# Patient Record
Sex: Female | Born: 1950 | Race: Black or African American | Hispanic: No | Smoking: Former smoker
Health system: Southern US, Community
[De-identification: ages and names within clinical notes are randomized; demographics above are authoritative.]

## PROBLEM LIST (undated history)

## (undated) DIAGNOSIS — T7840XA Allergy, unspecified, initial encounter: Secondary | ICD-10-CM

## (undated) DIAGNOSIS — T8859XA Other complications of anesthesia, initial encounter: Secondary | ICD-10-CM

## (undated) DIAGNOSIS — F32A Depression, unspecified: Secondary | ICD-10-CM

## (undated) DIAGNOSIS — C50911 Malignant neoplasm of unspecified site of right female breast: Secondary | ICD-10-CM

## (undated) DIAGNOSIS — F329 Major depressive disorder, single episode, unspecified: Secondary | ICD-10-CM

## (undated) DIAGNOSIS — K219 Gastro-esophageal reflux disease without esophagitis: Secondary | ICD-10-CM

## (undated) DIAGNOSIS — M797 Fibromyalgia: Secondary | ICD-10-CM

## (undated) DIAGNOSIS — I2510 Atherosclerotic heart disease of native coronary artery without angina pectoris: Secondary | ICD-10-CM

## (undated) DIAGNOSIS — R0600 Dyspnea, unspecified: Secondary | ICD-10-CM

## (undated) DIAGNOSIS — M51369 Other intervertebral disc degeneration, lumbar region without mention of lumbar back pain or lower extremity pain: Secondary | ICD-10-CM

## (undated) DIAGNOSIS — F419 Anxiety disorder, unspecified: Secondary | ICD-10-CM

## (undated) DIAGNOSIS — M4800 Spinal stenosis, site unspecified: Secondary | ICD-10-CM

## (undated) DIAGNOSIS — M5136 Other intervertebral disc degeneration, lumbar region: Secondary | ICD-10-CM

## (undated) DIAGNOSIS — E559 Vitamin D deficiency, unspecified: Secondary | ICD-10-CM

## (undated) DIAGNOSIS — I10 Essential (primary) hypertension: Secondary | ICD-10-CM

## (undated) HISTORY — PX: ABDOMINAL HYSTERECTOMY: SHX81

## (undated) HISTORY — PX: TOE SURGERY: SHX1073

## (undated) HISTORY — DX: Allergy, unspecified, initial encounter: T78.40XA

## (undated) HISTORY — PX: SHOULDER SURGERY: SHX246

---

## 1996-10-20 DIAGNOSIS — C50911 Malignant neoplasm of unspecified site of right female breast: Secondary | ICD-10-CM

## 1996-10-20 DIAGNOSIS — C50919 Malignant neoplasm of unspecified site of unspecified female breast: Secondary | ICD-10-CM

## 1996-10-20 HISTORY — DX: Malignant neoplasm of unspecified site of unspecified female breast: C50.919

## 1996-10-20 HISTORY — PX: MASTECTOMY: SHX3

## 1996-10-20 HISTORY — PX: BREAST BIOPSY: SHX20

## 1996-10-20 HISTORY — DX: Malignant neoplasm of unspecified site of right female breast: C50.911

## 2010-10-20 HISTORY — PX: AUGMENTATION MAMMAPLASTY: SUR837

## 2010-10-20 HISTORY — PX: REDUCTION MAMMAPLASTY: SUR839

## 2011-02-14 DIAGNOSIS — I2510 Atherosclerotic heart disease of native coronary artery without angina pectoris: Secondary | ICD-10-CM | POA: Insufficient documentation

## 2011-02-14 DIAGNOSIS — E785 Hyperlipidemia, unspecified: Secondary | ICD-10-CM | POA: Insufficient documentation

## 2011-04-07 DIAGNOSIS — Z79899 Other long term (current) drug therapy: Secondary | ICD-10-CM | POA: Insufficient documentation

## 2012-09-06 DIAGNOSIS — I2510 Atherosclerotic heart disease of native coronary artery without angina pectoris: Secondary | ICD-10-CM | POA: Insufficient documentation

## 2012-09-06 DIAGNOSIS — F419 Anxiety disorder, unspecified: Secondary | ICD-10-CM | POA: Insufficient documentation

## 2012-09-06 DIAGNOSIS — M5136 Other intervertebral disc degeneration, lumbar region: Secondary | ICD-10-CM | POA: Insufficient documentation

## 2012-09-06 DIAGNOSIS — M797 Fibromyalgia: Secondary | ICD-10-CM | POA: Insufficient documentation

## 2012-09-06 DIAGNOSIS — M5137 Other intervertebral disc degeneration, lumbosacral region: Secondary | ICD-10-CM | POA: Insufficient documentation

## 2012-09-06 DIAGNOSIS — F411 Generalized anxiety disorder: Secondary | ICD-10-CM | POA: Insufficient documentation

## 2012-09-06 DIAGNOSIS — F329 Major depressive disorder, single episode, unspecified: Secondary | ICD-10-CM | POA: Insufficient documentation

## 2012-09-06 DIAGNOSIS — M51379 Other intervertebral disc degeneration, lumbosacral region without mention of lumbar back pain or lower extremity pain: Secondary | ICD-10-CM | POA: Insufficient documentation

## 2014-08-30 DIAGNOSIS — C50919 Malignant neoplasm of unspecified site of unspecified female breast: Secondary | ICD-10-CM | POA: Insufficient documentation

## 2015-02-15 DIAGNOSIS — I10 Essential (primary) hypertension: Secondary | ICD-10-CM | POA: Insufficient documentation

## 2015-09-07 DIAGNOSIS — E559 Vitamin D deficiency, unspecified: Secondary | ICD-10-CM | POA: Insufficient documentation

## 2016-02-18 ENCOUNTER — Ambulatory Visit: Payer: Self-pay | Attending: Family Medicine | Admitting: Physical Therapy

## 2016-02-19 DIAGNOSIS — E782 Mixed hyperlipidemia: Secondary | ICD-10-CM | POA: Insufficient documentation

## 2016-02-19 DIAGNOSIS — I10 Essential (primary) hypertension: Secondary | ICD-10-CM | POA: Insufficient documentation

## 2016-02-20 ENCOUNTER — Other Ambulatory Visit: Payer: Self-pay | Admitting: Family Medicine

## 2016-02-20 DIAGNOSIS — Z780 Asymptomatic menopausal state: Secondary | ICD-10-CM

## 2016-07-31 ENCOUNTER — Other Ambulatory Visit: Payer: Self-pay | Admitting: *Deleted

## 2016-07-31 ENCOUNTER — Other Ambulatory Visit: Payer: Self-pay | Admitting: Internal Medicine

## 2016-07-31 ENCOUNTER — Inpatient Hospital Stay
Admission: RE | Admit: 2016-07-31 | Discharge: 2016-07-31 | Disposition: A | Discharge status: Home / Self Care | Payer: Self-pay | Source: Ambulatory Visit | Attending: *Deleted | Admitting: *Deleted

## 2016-07-31 DIAGNOSIS — Z1231 Encounter for screening mammogram for malignant neoplasm of breast: Secondary | ICD-10-CM

## 2016-07-31 DIAGNOSIS — Z9289 Personal history of other medical treatment: Secondary | ICD-10-CM

## 2016-08-26 ENCOUNTER — Ambulatory Visit
Admission: RE | Admit: 2016-08-26 | Discharge: 2016-08-26 | Disposition: A | Discharge status: Home / Self Care | Payer: Medicare Other | Source: Ambulatory Visit | Attending: Internal Medicine | Admitting: Internal Medicine

## 2016-08-26 ENCOUNTER — Other Ambulatory Visit: Payer: Self-pay | Admitting: Internal Medicine

## 2016-08-26 DIAGNOSIS — Z1231 Encounter for screening mammogram for malignant neoplasm of breast: Secondary | ICD-10-CM | POA: Diagnosis present

## 2016-11-21 ENCOUNTER — Encounter: Payer: Self-pay | Admitting: *Deleted

## 2016-11-21 ENCOUNTER — Ambulatory Visit: Payer: Medicare Other | Admitting: Anesthesiology

## 2016-11-21 ENCOUNTER — Ambulatory Visit
Admission: RE | Admit: 2016-11-21 | Discharge: 2016-11-21 | Disposition: A | Discharge status: Home / Self Care | Payer: Medicare Other | Source: Ambulatory Visit | Attending: Gastroenterology | Admitting: Gastroenterology

## 2016-11-21 ENCOUNTER — Encounter
Admission: RE | Disposition: A | Discharge status: Home / Self Care | Payer: Self-pay | Source: Ambulatory Visit | Attending: Gastroenterology

## 2016-11-21 DIAGNOSIS — Z7982 Long term (current) use of aspirin: Secondary | ICD-10-CM | POA: Insufficient documentation

## 2016-11-21 DIAGNOSIS — Z6833 Body mass index (BMI) 33.0-33.9, adult: Secondary | ICD-10-CM | POA: Diagnosis not present

## 2016-11-21 DIAGNOSIS — F329 Major depressive disorder, single episode, unspecified: Secondary | ICD-10-CM | POA: Diagnosis not present

## 2016-11-21 DIAGNOSIS — Z853 Personal history of malignant neoplasm of breast: Secondary | ICD-10-CM | POA: Insufficient documentation

## 2016-11-21 DIAGNOSIS — M1990 Unspecified osteoarthritis, unspecified site: Secondary | ICD-10-CM | POA: Diagnosis not present

## 2016-11-21 DIAGNOSIS — Z1211 Encounter for screening for malignant neoplasm of colon: Secondary | ICD-10-CM | POA: Insufficient documentation

## 2016-11-21 DIAGNOSIS — D128 Benign neoplasm of rectum: Secondary | ICD-10-CM | POA: Diagnosis not present

## 2016-11-21 DIAGNOSIS — I10 Essential (primary) hypertension: Secondary | ICD-10-CM | POA: Insufficient documentation

## 2016-11-21 DIAGNOSIS — M5136 Other intervertebral disc degeneration, lumbar region: Secondary | ICD-10-CM | POA: Insufficient documentation

## 2016-11-21 DIAGNOSIS — E559 Vitamin D deficiency, unspecified: Secondary | ICD-10-CM | POA: Insufficient documentation

## 2016-11-21 DIAGNOSIS — F419 Anxiety disorder, unspecified: Secondary | ICD-10-CM | POA: Insufficient documentation

## 2016-11-21 DIAGNOSIS — Z79899 Other long term (current) drug therapy: Secondary | ICD-10-CM | POA: Insufficient documentation

## 2016-11-21 DIAGNOSIS — M797 Fibromyalgia: Secondary | ICD-10-CM | POA: Insufficient documentation

## 2016-11-21 DIAGNOSIS — D124 Benign neoplasm of descending colon: Secondary | ICD-10-CM | POA: Diagnosis not present

## 2016-11-21 DIAGNOSIS — E669 Obesity, unspecified: Secondary | ICD-10-CM | POA: Diagnosis not present

## 2016-11-21 DIAGNOSIS — I2510 Atherosclerotic heart disease of native coronary artery without angina pectoris: Secondary | ICD-10-CM | POA: Diagnosis not present

## 2016-11-21 DIAGNOSIS — Z87891 Personal history of nicotine dependence: Secondary | ICD-10-CM | POA: Insufficient documentation

## 2016-11-21 DIAGNOSIS — K56609 Unspecified intestinal obstruction, unspecified as to partial versus complete obstruction: Secondary | ICD-10-CM | POA: Diagnosis not present

## 2016-11-21 DIAGNOSIS — K219 Gastro-esophageal reflux disease without esophagitis: Secondary | ICD-10-CM | POA: Insufficient documentation

## 2016-11-21 HISTORY — DX: Depression, unspecified: F32.A

## 2016-11-21 HISTORY — PX: COLONOSCOPY WITH PROPOFOL: SHX5780

## 2016-11-21 HISTORY — DX: Essential (primary) hypertension: I10

## 2016-11-21 HISTORY — DX: Vitamin D deficiency, unspecified: E55.9

## 2016-11-21 HISTORY — DX: Gastro-esophageal reflux disease without esophagitis: K21.9

## 2016-11-21 HISTORY — DX: Anxiety disorder, unspecified: F41.9

## 2016-11-21 HISTORY — DX: Major depressive disorder, single episode, unspecified: F32.9

## 2016-11-21 HISTORY — DX: Dyspnea, unspecified: R06.00

## 2016-11-21 HISTORY — DX: Spinal stenosis, site unspecified: M48.00

## 2016-11-21 HISTORY — DX: Other intervertebral disc degeneration, lumbar region without mention of lumbar back pain or lower extremity pain: M51.369

## 2016-11-21 HISTORY — DX: Other intervertebral disc degeneration, lumbar region: M51.36

## 2016-11-21 HISTORY — DX: Atherosclerotic heart disease of native coronary artery without angina pectoris: I25.10

## 2016-11-21 HISTORY — DX: Fibromyalgia: M79.7

## 2016-11-21 SURGERY — COLONOSCOPY WITH PROPOFOL
Anesthesia: General

## 2016-11-21 MED ORDER — LIDOCAINE HCL (CARDIAC) 20 MG/ML IV SOLN
INTRAVENOUS | Status: DC | PRN
Start: 2016-11-21 — End: 2016-11-21
  Administered 2016-11-21: 100 mg via INTRAVENOUS

## 2016-11-21 MED ORDER — SODIUM CHLORIDE 0.9 % IV SOLN: INTRAVENOUS | Status: DC

## 2016-11-21 MED ORDER — PROPOFOL 500 MG/50ML IV EMUL
INTRAVENOUS | Status: AC
  Filled 2016-11-21: qty 50

## 2016-11-21 MED ORDER — SODIUM CHLORIDE 0.9 % IV SOLN
INTRAVENOUS | Status: DC
  Administered 2016-11-21: 1000 mL via INTRAVENOUS
  Administered 2016-11-21: 10:00:00 via INTRAVENOUS

## 2016-11-21 MED ORDER — LIDOCAINE HCL (PF) 2 % IJ SOLN
INTRAMUSCULAR | Status: AC
  Filled 2016-11-21: qty 2

## 2016-11-21 MED ORDER — PROPOFOL 10 MG/ML IV BOLUS
INTRAVENOUS | Status: DC | PRN
  Administered 2016-11-21: 60 mg via INTRAVENOUS

## 2016-11-21 MED ORDER — PROPOFOL 500 MG/50ML IV EMUL
INTRAVENOUS | Status: DC | PRN
  Administered 2016-11-21: 150 ug/kg/min via INTRAVENOUS

## 2016-11-21 NOTE — Op Note (Signed)
[facility] Patient Name: Nancy Day Procedure Date: 11/21/2016 10:03 AM MRN: [id number] Account #: [id number] Date of Birth: 1951/04/26 Admit Type: Outpatient Age: 66 Room: [facility] ENDO ROOM 3 Gender: Female Note Status: Finalized Procedure:            Colonoscopy Indications:          Screening for colorectal malignant neoplasm Providers:            Lollie Sails, MD Referring MD:         Leonie Douglas. Doy Hutching, MD (Referring MD) Medicines:            Monitored Anesthesia Care Complications:        No immediate complications. Procedure:            Pre-Anesthesia Assessment:                       - ASA Grade Assessment: III - A patient with severe                        systemic disease.                       After obtaining informed consent, the colonoscope was                        passed under direct vision. Throughout the procedure,                        the patient's blood pressure, pulse, and oxygen                        saturations were monitored continuously. The                        Colonoscope was introduced through the anus with the                        intention of advancing to the cecum. The scope was                        advanced to the splenic flexure before the procedure                        was aborted. Medications were given. The colonoscopy                        was extremely difficult due to stenosis. The patient                        tolerated the procedure well. The quality of the bowel                        preparation was good. Findings:      Two sessile polyps were found in the rectum. The polyps were 1 to 2 mm       in size. These polyps were removed with a hot biopsy forceps. Resection       and retrieval were complete.      A 2 mm polyp was found in the proximal descending colon. The polyp was       sessile.  The polyp was removed with a cold biopsy forceps. Resection and       retrieval were complete.    A Marked stenosis was found at the splenic flexure and was       non-traversed. The scope was advanced to this area, then could not be       taken further despite multiple position changes and abdominal suport.      The digital rectal exam was normal. Impression:           - Two 1 to 2 mm polyps in the rectum, removed with a                        hot biopsy forceps. Resected and retrieved.                       - One 2 mm polyp in the proximal descending colon,                        removed with a cold biopsy forceps. Resected and                        retrieved.                       - Stricture at the splenic flexure. Recommendation:       - Perform a virtual colonoscopy in 2 weeks. Procedure Code(s):    --- Professional ---                       [id number], 52, Colonoscopy, flexible; with removal of                        tumor(s), polyp(s), or other lesion(s) by hot biopsy                        forceps                       45380, 59,52, Colonoscopy, flexible; with biopsy,                        single or multiple Diagnosis Code(s):    --- Professional ---                       Z12.11, Encounter for screening for malignant neoplasm                        of colon                       K62.1, Rectal polyp                       D12.4, Benign neoplasm of descending colon                       K56.69, Other intestinal obstruction CPT copyright 2016 [employer]. All rights reserved. The codes documented in this report are preliminary and upon coder review may  be revised to meet current compliance requirements. Lollie Sails, MD 11/21/2016 10:56:48 AM This report has been signed electronically. Number of Addenda: 0 Note Initiated On: 11/21/2016 10:03  AM Total Procedure Duration: 0 hours 39 minutes 35 seconds       [facility]

## 2016-11-21 NOTE — Anesthesia Preprocedure Evaluation (Signed)
Anesthesia Evaluation  Patient identified by MRN, date of birth, ID band Patient awake    Reviewed: Allergy & Precautions, NPO status , Patient's Chart, lab work & pertinent test results  History of Anesthesia Complications Negative for: history of anesthetic complications  Airway Mallampati: II  TM Distance: >3 FB Neck ROM: Full    Dental no notable dental hx.    Pulmonary neg shortness of breath, neg sleep apnea, neg COPD, former smoker,    breath sounds clear to auscultation- rhonchi (-) wheezing      Cardiovascular Exercise Tolerance: Good hypertension, Pt. on medications (-) angina+ CAD (nonocclusive)   Rhythm:Regular Rate:Normal - Systolic murmurs and - Diastolic murmurs    Neuro/Psych PSYCHIATRIC DISORDERS Anxiety Depression    GI/Hepatic Neg liver ROS, GERD  ,  Endo/Other  negative endocrine ROSneg diabetes  Renal/GU negative Renal ROS     Musculoskeletal  (+) Arthritis , Fibromyalgia -  Abdominal (+) + obese,   Peds  Hematology negative hematology ROS (+)   Anesthesia Other Findings Past Medical History: No date: Anxiety 1998: Breast cancer ([place])     Comment: mastectomy and reconstruction in 2012 No date: Coronary artery disease No date: DDD (degenerative disc disease), lumbar No date: Depression No date: Dyspnea No date: Fibromyalgia No date: GERD (gastroesophageal reflux disease) No date: Hypertension No date: Spinal stenosis No date: Vitamin D deficiency   Reproductive/Obstetrics                             Anesthesia Physical Anesthesia Plan  ASA: III  Anesthesia Plan: General   Post-op Pain Management:    Induction: Intravenous  Airway Management Planned: Natural Airway  Additional Equipment:   Intra-op Plan:   Post-operative Plan:   Informed Consent: I have reviewed the patients History and Physical, chart, labs and discussed the procedure including  the risks, benefits and alternatives for the proposed anesthesia with the patient or authorized representative who has indicated his/her understanding and acceptance.   Dental advisory given  Plan Discussed with: CRNA and Anesthesiologist  Anesthesia Plan Comments:         Anesthesia Quick Evaluation

## 2016-11-21 NOTE — Transfer of Care (Signed)
Immediate Anesthesia Transfer of Care Note  Patient: Nancy Day  Procedure(s) Performed: Procedure(s): COLONOSCOPY WITH PROPOFOL (N/A)  Patient Location: Endoscopy Unit  Anesthesia Type:General  Level of Consciousness: awake, alert , oriented and patient cooperative  Airway & Oxygen Therapy: Patient Spontanous Breathing and Patient connected to nasal cannula oxygen  Post-op Assessment: Report given to RN, Post -op Vital signs reviewed and stable and Patient moving all extremities X 4  Post vital signs: Reviewed and stable  Last Vitals:  Vitals:   11/21/16 0945  BP: 125/86  Pulse: 87  Resp: 18  Temp: 36.2 C    Last Pain:  Vitals:   11/21/16 0945  TempSrc: Tympanic         Complications: No apparent anesthesia complications

## 2016-11-21 NOTE — Anesthesia Post-op Follow-up Note (Cosign Needed)
Anesthesia QCDR form completed.        

## 2016-11-21 NOTE — Anesthesia Postprocedure Evaluation (Signed)
Anesthesia Post Note  Patient: Arrington Krebsbach  Procedure(s) Performed: Procedure(s) (LRB): COLONOSCOPY WITH PROPOFOL (N/A)  Patient location during evaluation: Endoscopy Anesthesia Type: General Level of consciousness: awake and alert and oriented Pain management: pain level controlled Vital Signs Assessment: post-procedure vital signs reviewed and stable Respiratory status: spontaneous breathing, nonlabored ventilation and respiratory function stable Cardiovascular status: blood pressure returned to baseline and stable Postop Assessment: no signs of nausea or vomiting Anesthetic complications: no     Last Vitals:  Vitals:   11/21/16 1120 11/21/16 1130  BP: (!) 145/76 (!) 167/81  Pulse: 74 93  Resp: 19 11  Temp:      Last Pain:  Vitals:   11/21/16 1050  TempSrc: Tympanic                 Rozanne Heumann

## 2016-11-21 NOTE — H&P (Signed)
Outpatient short stay form Pre-procedure 11/21/2016 9:33 AM Lollie Sails MD  Primary Physician: Dr. Fulton Reek  Reason for visit:  Colonoscopy  History of present illness:  Patient is a 66 year old female presenting today for a screening colonoscopy. She takes an 81 mg aspirin daily. Her last colonoscopy was about 10 years ago. There is no personal or family history of colon polyps or colon cancer. She tolerated her prep well. She does take a 81 mg aspirin daily but has not for the past 2 days. She takes no other aspirin products or blood thinning agents.     Current Facility-Administered Medications:  .  0.9 %  sodium chloride infusion, , Intravenous, Continuous, Lollie Sails, MD  Prescriptions Prior to Admission  Medication Sig Dispense Refill Last Dose  . acetaminophen (TYLENOL) 500 MG tablet Take 500 mg by mouth every 6 (six) hours as needed.     Marland Kitchen aspirin EC 81 MG tablet Take 81 mg by mouth daily.     Marland Kitchen BIOTIN PO Take 15,000 mcg by mouth daily at 2 PM.     . Cholecalciferol 10000 units TABS Take by mouth.     . Cinnamon 500 MG TABS Take by mouth daily at 2 PM.     . cyclobenzaprine (FLEXERIL) 10 MG tablet Take 10 mg by mouth 3 (three) times daily as needed for muscle spasms.     Marland Kitchen diltiazem (TIAZAC) 360 MG 24 hr capsule Take 360 mg by mouth daily.     . DULoxetine (CYMBALTA) 30 MG capsule Take 30 mg by mouth 2 (two) times daily.     . Garlic 123XX123 MG CAPS Take by mouth daily at 2 PM.     . losartan-hydrochlorothiazide (HYZAAR) 100-25 MG tablet Take 1 tablet by mouth daily.     . niacin (NIASPAN) 1000 MG CR tablet Take 1,000 mg by mouth at bedtime.     Marland Kitchen omeprazole (PRILOSEC) 20 MG capsule Take 20 mg by mouth daily.     . potassium chloride (K-DUR) 10 MEQ tablet Take 10 mEq by mouth 2 (two) times daily.     . pregabalin (LYRICA) 100 MG capsule Take 100 mg by mouth 3 (three) times daily.     . rosuvastatin (CRESTOR) 20 MG tablet Take 20 mg by mouth daily.     . traMADol  (ULTRAM) 50 MG tablet Take by mouth 3 (three) times daily as needed.     . Turmeric POWD by Does not apply route every morning.     . zolpidem (AMBIEN) 10 MG tablet Take 5 mg by mouth at bedtime as needed for sleep.        Allergies not on file   Past Medical History:  Diagnosis Date  . Anxiety   . Breast cancer ([place]) 1998   mastectomy and reconstruction in 2012  . Coronary artery disease   . DDD (degenerative disc disease), lumbar   . Depression   . Dyspnea   . Fibromyalgia   . GERD (gastroesophageal reflux disease)   . Hypertension   . Spinal stenosis   . Vitamin D deficiency     Review of systems:      Physical Exam    Heart and lungs: Regular rate and rhythm without rub or gallop, lungs are bilaterally clear.    HEENT: Normocephalic atraumatic eyes are anicteric    Other:     Pertinant exam for procedure: Soft nontender nondistended bowel sounds positive normoactive.    Planned proceedures: Colonoscopy and  indicated procedures. I have discussed the risks benefits and complications of procedures to include not limited to bleeding, infection, perforation and the risk of sedation and the patient wishes to proceed.    Lollie Sails, MD Gastroenterology 11/21/2016  9:33 AM

## 2016-11-24 ENCOUNTER — Encounter: Payer: Self-pay | Admitting: Gastroenterology

## 2016-11-24 LAB — SURGICAL PATHOLOGY

## 2016-11-25 ENCOUNTER — Other Ambulatory Visit: Payer: Self-pay | Admitting: Gastroenterology

## 2016-11-25 DIAGNOSIS — Z860100 Personal history of colon polyps, unspecified: Secondary | ICD-10-CM

## 2016-12-31 ENCOUNTER — Ambulatory Visit
Admission: RE | Admit: 2016-12-31 | Discharge: 2016-12-31 | Disposition: A | Discharge status: Home / Self Care | Payer: Medicare Other | Source: Ambulatory Visit | Attending: Gastroenterology | Admitting: Gastroenterology

## 2016-12-31 DIAGNOSIS — Z860100 Personal history of colon polyps, unspecified: Secondary | ICD-10-CM

## 2017-05-21 DIAGNOSIS — R42 Dizziness and giddiness: Secondary | ICD-10-CM | POA: Insufficient documentation

## 2017-06-16 ENCOUNTER — Telehealth (HOSPITAL_COMMUNITY): Payer: Self-pay

## 2017-06-16 ENCOUNTER — Ambulatory Visit: Payer: Medicare Other | Attending: Nurse Practitioner | Admitting: Nurse Practitioner

## 2017-06-16 ENCOUNTER — Encounter: Payer: Self-pay | Admitting: Nurse Practitioner

## 2017-06-16 VITALS — BP 138/92 | HR 96 | Temp 98.1°F | Resp 16 | Ht 67.5 in | Wt 223.0 lb

## 2017-06-16 DIAGNOSIS — G8929 Other chronic pain: Secondary | ICD-10-CM | POA: Diagnosis not present

## 2017-06-16 DIAGNOSIS — M25511 Pain in right shoulder: Secondary | ICD-10-CM | POA: Diagnosis not present

## 2017-06-16 DIAGNOSIS — M533 Sacrococcygeal disorders, not elsewhere classified: Secondary | ICD-10-CM | POA: Diagnosis not present

## 2017-06-16 DIAGNOSIS — M545 Low back pain: Secondary | ICD-10-CM | POA: Diagnosis not present

## 2017-06-16 DIAGNOSIS — M25562 Pain in left knee: Secondary | ICD-10-CM | POA: Diagnosis not present

## 2017-06-16 DIAGNOSIS — I129 Hypertensive chronic kidney disease with stage 1 through stage 4 chronic kidney disease, or unspecified chronic kidney disease: Secondary | ICD-10-CM | POA: Diagnosis not present

## 2017-06-16 DIAGNOSIS — Z7982 Long term (current) use of aspirin: Secondary | ICD-10-CM | POA: Insufficient documentation

## 2017-06-16 DIAGNOSIS — G894 Chronic pain syndrome: Secondary | ICD-10-CM | POA: Diagnosis not present

## 2017-06-16 DIAGNOSIS — Z79899 Other long term (current) drug therapy: Secondary | ICD-10-CM | POA: Insufficient documentation

## 2017-06-16 DIAGNOSIS — Z885 Allergy status to narcotic agent status: Secondary | ICD-10-CM | POA: Diagnosis not present

## 2017-06-16 DIAGNOSIS — Z90710 Acquired absence of both cervix and uterus: Secondary | ICD-10-CM | POA: Diagnosis not present

## 2017-06-16 DIAGNOSIS — M5442 Lumbago with sciatica, left side: Secondary | ICD-10-CM

## 2017-06-16 DIAGNOSIS — Z87891 Personal history of nicotine dependence: Secondary | ICD-10-CM | POA: Insufficient documentation

## 2017-06-16 DIAGNOSIS — Z853 Personal history of malignant neoplasm of breast: Secondary | ICD-10-CM | POA: Diagnosis not present

## 2017-06-16 DIAGNOSIS — M542 Cervicalgia: Secondary | ICD-10-CM

## 2017-06-16 DIAGNOSIS — Z9889 Other specified postprocedural states: Secondary | ICD-10-CM | POA: Diagnosis not present

## 2017-06-16 DIAGNOSIS — M79604 Pain in right leg: Secondary | ICD-10-CM | POA: Diagnosis present

## 2017-06-16 DIAGNOSIS — M25512 Pain in left shoulder: Secondary | ICD-10-CM

## 2017-06-16 DIAGNOSIS — K589 Irritable bowel syndrome without diarrhea: Secondary | ICD-10-CM | POA: Insufficient documentation

## 2017-06-16 DIAGNOSIS — M79605 Pain in left leg: Secondary | ICD-10-CM

## 2017-06-16 DIAGNOSIS — M797 Fibromyalgia: Secondary | ICD-10-CM | POA: Insufficient documentation

## 2017-06-16 DIAGNOSIS — E782 Mixed hyperlipidemia: Secondary | ICD-10-CM | POA: Diagnosis not present

## 2017-06-16 DIAGNOSIS — K219 Gastro-esophageal reflux disease without esophagitis: Secondary | ICD-10-CM | POA: Insufficient documentation

## 2017-06-16 DIAGNOSIS — M5441 Lumbago with sciatica, right side: Secondary | ICD-10-CM

## 2017-06-16 DIAGNOSIS — I2510 Atherosclerotic heart disease of native coronary artery without angina pectoris: Secondary | ICD-10-CM | POA: Diagnosis not present

## 2017-06-16 DIAGNOSIS — Z79891 Long term (current) use of opiate analgesic: Secondary | ICD-10-CM | POA: Diagnosis not present

## 2017-06-16 NOTE — Progress Notes (Signed)
Patient's Name: Nancy Day  MRN: [id number]  Referring Provider: Idelle Crouch, MD  DOB: 1951-06-24  PCP: Idelle Crouch, MD  DOS: 06/16/2017  Note by: Dionisio David NP  Service setting: Ambulatory outpatient  Specialty: Interventional Pain Management  Location: [facility] (AMB) Pain Management Facility    Patient type: New Patient    Primary Reason(s) for Visit: Initial Patient Evaluation CC: Fibromyalgia (entire body ) and Knee Pain (left)  HPI  Nancy Day is a 66 y.o. year old, female patient, who comes today for an initial evaluation. She has history of Breast cancer ([place]); DDD (degenerative disc disease), lumbar; DDD (degenerative disc disease), lumbosacral; and Chronic pain syndrome;  on her problem list.. Her primarily concern today is the Fibromyalgia (entire body ) and Knee Pain (left)  Pain Assessment: Location: Other (Comment) (entire body) Neck Radiating: neck to feet Onset: More than a month ago Duration: Chronic pain Quality: Constant, Numbness, Throbbing, Aching (sensitive to touch) Severity: 7 /10 (self-reported pain score)  Note: Reported level is compatible with observation.                   Effect on ADL: cantdo what she wants to do,washing dishes, rest Timing:   Modifying factors: medications Lyrica and Tramadol,mattress pad heater  Onset and Duration: Date of onset: 66 and Present longer than 3 months Cause of pain: Fibromyalgia, Spinal Stenosis, Sciatica, Degengerative Disk Disese Severity: Getting worse, NAS-11 at its worse: 10/10, NAS-11 at its best: 6/10, NAS-11 now: 7/10 and NAS-11 on the average: 7/10 Timing: Not influenced by the time of the day Aggravating Factors: Bending, Climbing, Kneeling, Motion, Prolonged standing, Squatting, Stooping , Walking, Walking uphill and Walking downhill Alleviating Factors: Lying down, Medications and Nothing so far Associated Problems: Depression, Fatigue, Inability to concentrate, Numbness, Sadness, Spasms, Suicidal  ideations, Sweating, Weakness, Pain that wakes patient up and Pain that does not allow patient to sleep Quality of Pain: Aching, Agonizing, Annoying, Disabling, Distressing, Feeling of constriction, Feeling of weight, Getting shorter, Horrible, Itching, Nagging and Throbbing Previous Examinations or Tests: Epidurogram Previous Treatments: Epidural steroid injections and TENS  The patient comes into the clinics today for the first time for a chronic pain management evaluation. According to the patient she has generalized pain secondary to fibromyalgia. She admits she was diagnosed in 73. She admits that she has failed Lyrica and gabapentin. She has previously been on tramadol which she has stopped.  Her second area of pain is in her lower back. She admits that she's been having back pain for 10-15 years. She denies any precipitating factors she denies any previous surgeries. She has had epidural steroid injections in the past from [facility]; they were effective for approximately one week. She states that over time the efficacy continue to decrease that she stopped the injections. She denies any recent physical therapy or images.  Her third area of pain is in her lower extremities she admits the left is greater than the right. She has radicular symptoms that goes down to her knee. She does have some numbness tingling with recent weakness.   Her fourth area of pain is in her left knee. She is having swelling and weakness. She denies any previous surgery, interventional therapy, physical therapy. She did have recent x-rays at [facility] clinic.  She admits that she is having constant bilateral shoulder pain. with right being greater than left. She has had history of rotator cuff repair on the right 2017. She denies any numbness tingling does  have some limitations.  Her last area of pain is in her neck with right being greater than left The pain goes down into her shoulders. She denies any numbness  tingling in her upper extremities. She denies any previous surgery, interventional therapy, physical therapy or recent images.   Today I took the time to provide the patient with information regarding this pain practice. The patient was informed that the practice is divided into two sections: an interventional pain management section, as well as a completely separate and distinct medication management section. I explained that there are procedure days for interventional therapies, and evaluation days for follow-ups and medication management. Because of the amount of documentation required during both, they are kept separated. This means that there is the possibility that she may be scheduled for a procedure on one day, and medication management the next. I have also informedher that because of staffing and facility limitations, this practice will no longer take patients for medication management only. To illustrate the reasons for this, I gave the patient the example of surgeons, and how inappropriate it would be to refer a patient to her care, just to write for the post-surgical antibiotics on a surgery done by a different surgeon.   Because interventional pain management is part of the board-certified specialty for the doctors, the patient was informed that joining this practice means that they are open to any and all interventional therapies. I made it clear that this does not mean that they will be forced to have any procedures done. What this means is that I believe interventional therapies to be essential part of the diagnosis and proper management of chronic pain conditions. Therefore, patients not interested in these interventional alternatives will be better served under the care of a different practitioner.  The patient was also made aware of my Comprehensive Pain Management Safety Guidelines where by joining this practice, they limit all of their nerve blocks and joint injections to those done by  our practice, for as long as we are retained to manage their care. Historic Controlled Substance Pharmacotherapy Review  PMP and historical list of controlled substances: tramadol 50 mg, Lyrica 75 mg, zolpidem 10 mg, hydromorphone 2 mg, zolpidem extended release 12.5 mg Highest opioid analgesic regimen found:hydromorphone 2 mg2 tablets 3 times daily (fill date 02/21/2014) hydromorphone 48 mg per day Most recent opioid analgesic: tramadol 50 mg 3 times daily (fill date 06/27/2015)tramadol 150 mg per day Current opioid analgesics: tramadol Highest recorded MME/day: 48 mg/day MME/day:20 mg/day Medications: The patient did not bring the medication(s) to the appointment, as requested in our "New Patient Package" Pharmacodynamics: Desired effects: Analgesia: The patient reports >50% benefit. Reported improvement in function: The patient reports medication allows her to accomplish basic ADLs. Clinically meaningful improvement in function (CMIF): Sustained CMIF goals met Perceived effectiveness: Described as relatively effective, allowing for increase in activities of daily living (ADL) Undesirable effects: Side-effects or Adverse reactions: None reported Historical Monitoring: The patient  reports that she does not use drugs. List of all UDS Test(s): No results found for: MDMA, COCAINSCRNUR, PCPSCRNUR, PCPQUANT, CANNABQUANT, THCU, [place] List of all Serum Drug Screening Test(s):  No results found for: AMPHSCRSER, BARBSCRSER, BENZOSCRSER, COCAINSCRSER, PCPSCRSER, PCPQUANT, THCSCRSER, CANNABQUANT, OPIATESCRSER, OXYSCRSER, PROPOXSCRSER Historical Background Evaluation: [place] PDMP: Six (6) year initial data search conducted.             [place] Department of public safety, offender search: Editor, commissioning Information) Non-contributory Risk Assessment Profile: Aberrant behavior: None observed or detected today Risk  factors for fatal opioid overdose: None identified today Fatal overdose hazard ratio (HR): Calculation  deferred Non-fatal overdose hazard ratio (HR): Calculation deferred Risk of opioid abuse or dependence: 0.7-3.0% with doses ? 36 MME/day and 6.1-26% with doses ? 120 MME/day. Substance use disorder (SUD) risk level: Pending results of Medical Psychology Evaluation for SUD Opioid risk tool (ORT) (Total Score): 3  ORT Scoring interpretation table:  Score <3 = Low Risk for SUD  Score between 4-7 = Moderate Risk for SUD  Score >8 = High Risk for Opioid Abuse   PHQ-2 Depression Scale:  Total score: 2  PHQ-2 Scoring interpretation table: (Score and probability of major depressive disorder)  Score 0 = No depression  Score 1 = 15.4% Probability  Score 2 = 21.1% Probability  Score 3 = 38.4% Probability  Score 4 = 45.5% Probability  Score 5 = 56.4% Probability  Score 6 = 78.6% Probability   PHQ-9 Depression Scale:  Total score: 14  PHQ-9 Scoring interpretation table:  Score 0-4 = No depression  Score 5-9 = Mild depression  Score 10-14 = Moderate depression  Score 15-19 = Moderately severe depression  Score 20-27 = Severe depression (2.4 times higher risk of SUD and 2.89 times higher risk of overuse)   Pharmacologic Plan: Pending ordered tests and/or consults  Meds  The patient has a current medication list which includes the following prescription(s): acetaminophen, aspirin ec, biotin, cholecalciferol, cinnamon, coenzyme q-10, b-12, cyclobenzaprine, diltiazem, duloxetine, garlic, lidocaine, losartan-hydrochlorothiazide, niacin, pregabalin, rosuvastatin, turmeric, zolpidem, omeprazole, potassium chloride, and tramadol.  Current Outpatient Prescriptions on File Prior to Visit  Medication Sig  . acetaminophen (TYLENOL) 500 MG tablet Take 500 mg by mouth every 6 (six) hours as needed.  Marland Kitchen aspirin EC 81 MG tablet Take 81 mg by mouth daily.  Marland Kitchen BIOTIN PO Take 15,000 mcg by mouth daily at 2 PM.  . Cholecalciferol 10000 units TABS Take by mouth.  . Cinnamon 500 MG TABS Take by mouth daily at 2  PM.  . cyclobenzaprine (FLEXERIL) 10 MG tablet Take 10 mg by mouth 3 (three) times daily as needed for muscle spasms.  Marland Kitchen diltiazem (TIAZAC) 360 MG 24 hr capsule Take 360 mg by mouth daily.  . DULoxetine (CYMBALTA) 30 MG capsule Take 30 mg by mouth 2 (two) times daily.  . Garlic 3614 MG CAPS Take by mouth daily at 2 PM.  . losartan-hydrochlorothiazide (HYZAAR) 100-25 MG tablet Take 1 tablet by mouth daily.  . niacin (NIASPAN) 1000 MG CR tablet Take 1,000 mg by mouth at bedtime.  . pregabalin (LYRICA) 100 MG capsule Take 100 mg by mouth 3 (three) times daily.  . rosuvastatin (CRESTOR) 20 MG tablet Take 20 mg by mouth daily.  . Turmeric POWD by Does not apply route every morning.  . zolpidem (AMBIEN) 10 MG tablet Take 5 mg by mouth at bedtime as needed for sleep.  Marland Kitchen omeprazole (PRILOSEC) 20 MG capsule Take 20 mg by mouth daily.  . potassium chloride (K-DUR) 10 MEQ tablet Take 10 mEq by mouth 2 (two) times daily.  . traMADol (ULTRAM) 50 MG tablet Take by mouth 3 (three) times daily as needed.   No current facility-administered medications on file prior to visit.    Imaging Review  Note: No new results found.        ROS  Cardiovascular History: Daily Aspirin intake and High blood pressure Pulmonary or Respiratory History: No reported pulmonary signs or symptoms such as wheezing and difficulty taking a deep full breath (  Asthma), difficulty blowing air out (Emphysema), coughing up mucus (Bronchitis), persistent dry cough, or temporary stoppage of breathing during sleep Neurological History: No reported neurological signs or symptoms such as seizures, abnormal skin sensations, urinary and/or fecal incontinence, being born with an abnormal open spine and/or a tethered spinal cord Review of Past Neurological Studies: No results found for this or any previous visit. Psychological-Psychiatric History: Anxiousness, Depressed, Suicidal ideations and Difficulty sleeping and or falling  asleep Gastrointestinal History: Alternating episodes iof diarrhea and constipation (IBS-Irritable bowe syndrome) Genitourinary History: No reported renal or genitourinary signs or symptoms such as difficulty voiding or producing urine, peeing blood, non-functioning kidney, kidney stones, difficulty emptying the bladder, difficulty controlling the flow of urine, or chronic kidney disease Hematological History: Brusing easily Endocrine History: No reported endocrine signs or symptoms such as high or low blood sugar, rapid heart rate due to high thyroid levels, obesity or weight gain due to slow thyroid or thyroid disease Rheumatologic History: Generalized muscle aches (Fibromyalgia) and Constant unexplained fatigue (Chronic Fatigue Syndrome) Musculoskeletal History: Negative for myasthenia gravis, muscular dystrophy, multiple sclerosis or malignant hyperthermia Work History: Disabled  Allergies  Ms. Skufca is allergic to codeine.  Laboratory Chemistry  Inflammation Markers No results found for: CRP, ESRSEDRATE (CRP: Acute Phase) (ESR: Chronic Phase) Renal Function Markers No results found for: BUN, CREATININE, GFRAA, GFRNONAA Hepatic Function Markers No results found for: AST, ALT, ALBUMIN, ALKPHOS, HCVAB Electrolytes No results found for: NA, K, CL, CALCIUM, MG Neuropathy Markers No results found for: PQZRAQTM22 Bone Pathology Markers No results found for: Hendricks Milo, VD125OH2TOT, [id number], QJ3354TG2, 25OHVITD1, 25OHVITD2, 25OHVITD3, CALCIUM, TESTOFREE, TESTOSTERONE Coagulation Parameters No results found for: INR, LABPROT, APTT, PLT Cardiovascular Markers No results found for: BNP, HGB, HCT Note: Lab results reviewed.  [place]  Drug: Ms. Widjaja  reports that she does not use drugs. Alcohol:  reports that she drinks alcohol. Tobacco:  reports that she has quit smoking. She has never used smokeless tobacco. Medical:  has a past medical history of Allergy; Anxiety; Breast cancer  ([place]) (1998); Coronary artery disease; DDD (degenerative disc disease), lumbar; Depression; Dyspnea; Fibromyalgia; GERD (gastroesophageal reflux disease); Hypertension; Spinal stenosis; and Vitamin D deficiency. Family: family history is not on file.  Past Surgical History:  Procedure Laterality Date  . ABDOMINAL HYSTERECTOMY    . AUGMENTATION MAMMAPLASTY Right 2012   right  . BREAST BIOPSY Right 1998   stereotactic biopsy - positive  . COLONOSCOPY WITH PROPOFOL N/A 11/21/2016   Procedure: COLONOSCOPY WITH PROPOFOL;  Surgeon: Lollie Sails, MD;  Location: [facility] ENDOSCOPY;  Service: Endoscopy;  Laterality: N/A;  . MASTECTOMY Right 1998  . SHOULDER SURGERY    . TOE SURGERY     Active Ambulatory Problems    Diagnosis Date Noted  . Anxiety state 09/06/2012  . Anxiety 09/06/2012  . Benign essential HTN 02/19/2016  . Breast cancer ([place]) 08/30/2014  . CAD (coronary artery disease) 09/06/2012  . Coronary atherosclerosis 02/14/2011  . DDD (degenerative disc disease), lumbar 09/06/2012  . DDD (degenerative disc disease), lumbosacral 09/06/2012  . Depression 09/06/2012  . Depressive disorder 09/06/2012  . Dizziness 05/21/2017  . Encounter for long-term (current) use of other medications 04/07/2011  . Essential hypertension 02/15/2015  . Fibromyalgia(Primary Area of Pain)  09/06/2012  . Hyperlipidemia 02/14/2011  . Mixed hyperlipidemia 02/19/2016  . Myalgia and myositis 09/06/2012  . Spinal stenosis 08/30/2014  . Vitamin D deficiency, unspecified 09/07/2015  . Long term current use of opiate analgesic 06/16/2017  . Chronic pain syndrome  06/16/2017  . Chronic neck pain 06/16/2017  . Chronic pain of left knee (Fourth Area of Pain) 06/16/2017  . Chronic low back pain  (Secondary Area of Pain)(L>R) 06/16/2017  . Chronic sacroiliac joint pain 06/16/2017  . Chronic shoulder paintrack by(Fifth Area of Pain) (R>L) 06/16/2017  . Chronic pain of lower extremity (Secondary Area of Pain) (L>R)  06/16/2017   Resolved Ambulatory Problems    Diagnosis Date Noted  . No Resolved Ambulatory Problems   Past Medical History:  Diagnosis Date  . Allergy   . Anxiety   . Breast cancer ([place]) 1998  . Coronary artery disease   . DDD (degenerative disc disease), lumbar   . Depression   . Dyspnea   . Fibromyalgia   . GERD (gastroesophageal reflux disease)   . Hypertension   . Spinal stenosis   . Vitamin D deficiency    Constitutional Exam  General appearance: Well nourished, well developed, and well hydrated. In no apparent acute distress Vitals:   06/16/17 0805  BP: (!) 138/92  Pulse: 96  Resp: 16  Temp: 98.1 F (36.7 C)  SpO2: 100%  Weight: 223 lb (101.2 kg)  Height: 5' 7.5" (1.715 m)   BMI Assessment: Estimated body mass index is 34.41 kg/m as calculated from the following:   Height as of this encounter: 5' 7.5" (1.715 m).   Weight as of this encounter: 223 lb (101.2 kg). Psych/Mental status: Alert, oriented x 3 (person, place, & time)       Eyes: PERLA Respiratory: No evidence of acute respiratory distress  Cervical Spine Exam  Inspection: No masses, redness, or swelling Alignment: Symmetrical Functional ROM: Unrestricted ROM      Stability: No instability detected Muscle strength & Tone: Functionally intact Sensory: Unimpaired Palpation: Complains of area being tender to palpation              Upper Extremity (UE) Exam    Side: Right upper extremity  Side: Left upper extremity  Inspection: No masses, redness, swelling, or asymmetry. No contractures  Inspection: No masses, redness, swelling, or asymmetry. No contractures  Functional ROM: Decreased ROM          Functional ROM: Decreased ROM          Muscle strength & Tone: Movement possible against gravity, but not against resistance (3/5)  Muscle strength & Tone: Movement possible against gravity, but not against resistance (3/5)  Sensory: Unimpaired  Sensory: Unimpaired  Palpation: No palpable anomalies               Palpation: No palpable anomalies              Specialized Test(s): Deferred         Specialized Test(s): Deferred          Thoracic Spine Exam  Inspection: No masses, redness, or swelling Alignment: Symmetrical Functional ROM: Unrestricted ROM Stability: No instability detected Sensory: Unimpaired Muscle strength & Tone: No palpable anomalies  Lumbar Spine Exam  Inspection: No masses, redness, or swelling Alignment: Symmetrical Functional ROM: Pain restricted ROM, bilaterally Stability: No instability detected Muscle strength & Tone: Functionally intact Sensory: Unimpaired Palpation: Complains of area being tender to palpation       Provocative Tests: Lumbar Hyperextension and rotation test: Unable to perform       Patrick's Maneuver: evaluation deferred today for left-sided S-I arthralgia              Gait & Posture Assessment  Ambulation: Unassisted Gait: Relatively normal  for age and body habitus Posture: WNL   Lower Extremity Exam    Side: Right lower extremity  Side: Left lower extremity  Inspection: No masses, redness, swelling, or asymmetry. No contractures  Inspection: Edema  Functional ROM: Unrestricted ROM          Functional ROM: Pain restricted ROM for knee joint  Muscle strength & Tone: unable to perform heel-to-toe walking  Muscle strength & Tone: unable to perform heel-to-toe walking  Sensory: Unimpaired  Sensory: Unimpaired  Palpation: Complains of area being tender to palpation  Palpation: Complains of area being tender to palpation   Assessment  Primary Diagnosis & Pertinent Problem List: The primary encounter diagnosis was Fibromyalgia. Diagnoses of Chronic bilateral low back pain, with sciatica presence unspecified, Chronic pain of left knee, Chronic neck pain, Chronic pain of both shoulders, Chronic sacroiliac joint pain, Chronic pain syndrome, Long term current use of opiate analgesic, and Chronic pain of both lower extremities (Tertiary Area of Pain)  were also pertinent to this visit.  Visit Diagnosis: 1. Fibromyalgia   2. Chronic bilateral low back pain, with sciatica presence unspecified   3. Chronic pain of left knee   4. Chronic neck pain   5. Chronic pain of both shoulders   6. Chronic sacroiliac joint pain   7. Chronic pain syndrome   8. Long term current use of opiate analgesic   9. Chronic pain of both lower extremities (Tertiary Area of Pain)    Plan of Care  Initial treatment plan:  Please be advised that as per protocol, today's visit has been an evaluation only. We have not taken over the patient's controlled substance management.  Problem-specific plan: No problem-specific Assessment & Plan notes found for this encounter.  Ordered Lab-work, Procedure(s), Referral(s), & Consult(s): Orders Placed This Encounter  Procedures  . DG Cervical Spine Complete  . DG Lumbar Spine Complete W/Bend  . DG Si Joints  . Compliance Drug Analysis, Ur  . Ambulatory referral to Psychology   Pharmacotherapy: Medications ordered:  No orders of the defined types were placed in this encounter.  Medications administered during this visit: Ms. Peery had no medications administered during this visit.   Pharmacotherapy under consideration:  Opioid Analgesics: The patient was informed that there is no guarantee that she would be a candidate for opioid analgesics. The decision will be made following CDC guidelines. This decision will be based on the results of diagnostic studies, as well as Ms. Harroun's risk profile.  Membrane stabilizer: To be determined at a later time Muscle relaxant: To be determined at a later time NSAID: To be determined at a later time Other analgesic(s): To be determined at a later time   Interventional therapies under consideration: Ms. Keelin was informed that there is no guarantee that she would be a candidate for interventional therapies. The decision will be based on the results of diagnostic studies, as  well as Ms. Gitto's risk profile.  Possible procedure(s): Possible diagnostic bilateral cervical epidural steroid injection Possible diagnostic bilateral cervical facet injection Possible cervical radiofrequency ablation Possible right suprascapular nerve block Possible right suprascapular radiofrequency Possible bilateral lumbar epidural steroid injection Possible bilateral lumbar facet injection Possible Lumbar radiofrequency ablation   Provider-requested follow-up: Return for 2nd Visit, w/ Dr. Dossie Arbour, after [facility] eval.  No future appointments.  Primary Care Physician: Idelle Crouch, MD Location: [facility] Outpatient Pain Management Facility Note by:  Date: 06/16/2017; Time: 10:46 AM  Pain Score Disclaimer: We use the NRS-11 scale. This is a  self-reported, subjective measurement of pain severity with only modest accuracy. It is used primarily to identify changes within a particular patient. It must be understood that outpatient pain scales are significantly less accurate that those used for research, where they can be applied under ideal controlled circumstances with minimal exposure to variables. In reality, the score is likely to be a combination of pain intensity and pain affect, where pain affect describes the degree of emotional arousal or changes in action readiness caused by the sensory experience of pain. Factors such as social and work situation, setting, emotional state, anxiety levels, expectation, and prior pain experience may influence pain perception and show large inter-individual differences that may also be affected by time variables.  Patient instructions provided during this appointment: Patient Instructions    ____________________________________________________________________________________________  Appointment Policy Summary  It is our goal and responsibility to provide the medical community with assistance in the evaluation and management of patients with  chronic pain. Unfortunately our resources are limited. Because we do not have an unlimited amount of time, or available appointments, we are required to closely monitor and manage their use. The following rules exist to maximize their use:  Patient's responsibilities: 1. Punctuality:  At what time should I arrive? You should be physically present in our office 30 minutes before your scheduled appointment. Your scheduled appointment is with your assigned healthcare provider. However, it takes 5-10 minutes to be "checked-in", and another 15 minutes for the nurses to do the admission. If you arrive to our office at the time you were given for your appointment, you will end up being at least 20-25 minutes late to your appointment with the provider. 2. Tardiness:  What happens if I arrive only a few minutes after my scheduled appointment time? You will need to reschedule your appointment. The cutoff is your appointment time. This is why it is so important that you arrive at least 30 minutes before that appointment. If you have an appointment scheduled for 10:00 AM and you arrive at 10:01, you will be required to reschedule your appointment.  3. Plan ahead:  Always assume that you will encounter traffic on your way in. Plan for it. If you are dependent on a driver, make sure they understand these rules and the need to arrive early. 4. Other appointments and responsibilities:  Avoid scheduling any other appointments before or after your pain clinic appointments.  5. Be prepared:  Write down everything that you need to discuss with your healthcare provider and give this information to the admitting nurse. Write down the medications that you will need refilled. Bring your pills and bottles (even the empty ones), to all of your appointments, except for those where a procedure is scheduled. 6. No children or pets:  Find someone to take care of them. It is not appropriate to bring them in. 7. Scheduling changes:   We request "advanced notification" of any changes or cancellations. 8. Advanced notification:  Defined as a time period of more than 24 hours prior to the originally scheduled appointment. This allows for the appointment to be offered to other patients. 9. Rescheduling:  When a visit is rescheduled, it will require the cancellation of the original appointment. For this reason they both fall within the category of "Cancellations".  10. Cancellations:  They require advanced notification. Any cancellation less than 24 hours before the  appointment will be recorded as a "No Show". 11. No Show:  Defined as an unkept appointment where the patient failed to  notify or declare to the practice their intention or inability to keep the appointment.  Corrective process for repeat offenders:  1. Tardiness: Three (3) episodes of rescheduling due to late arrivals will be recorded as one (1) "No Show". 2. Cancellation or reschedule: Three (3) cancellations or rescheduling will be recorded as one (1) "No Show". 3. "No Shows": Three (3) "No Shows" within a 12 month period will result in discharge from the practice.  ____________________________________________________________________________________________  ____________________________________________________________________________________________  Pain Scale  Introduction: The pain score used by this practice is the Verbal Numerical Rating Scale (VNRS-11). This is an 11-point scale. It is for adults and children 10 years or older. There are significant differences in how the pain score is reported, used, and applied. Forget everything you learned in the past and learn this scoring system.  General Information: The scale should reflect your current level of pain. Unless you are specifically asked for the level of your worst pain, or your average pain. If you are asked for one of these two, then it should be understood that it is over the past 24  hours.  Basic Activities of Daily Living (ADL): Personal hygiene, dressing, eating, transferring, and using restroom.  Instructions: Most patients tend to report their level of pain as a combination of two factors, their physical pain and their psychosocial pain. This last one is also known as "suffering" and it is reflection of how physical pain affects you socially and psychologically. From now on, report them separately. From this point on, when asked to report your pain level, report only your physical pain. Use the following table for reference.  Pain Clinic Pain Levels (0-5/10)  Pain Level Score  Description  No Pain 0   Mild pain 1 Nagging, annoying, but does not interfere with basic activities of daily living (ADL). Patients are able to eat, bathe, get dressed, toileting (being able to get on and off the toilet and perform personal hygiene functions), transfer (move in and out of bed or a chair without assistance), and maintain continence (able to control bladder and bowel functions). Blood pressure and heart rate are unaffected. A normal heart rate for a healthy adult ranges from 60 to 100 bpm (beats per minute).   Mild to moderate pain 2 Noticeable and distracting. Impossible to hide from other people. More frequent flare-ups. Still possible to adapt and function close to normal. It can be very annoying and may have occasional stronger flare-ups. With discipline, patients may get used to it and adapt.   Moderate pain 3 Interferes significantly with activities of daily living (ADL). It becomes difficult to feed, bathe, get dressed, get on and off the toilet or to perform personal hygiene functions. Difficult to get in and out of bed or a chair without assistance. Very distracting. With effort, it can be ignored when deeply involved in activities.   Moderately severe pain 4 Impossible to ignore for more than a few minutes. With effort, patients may still be able to manage work or participate  in some social activities. Very difficult to concentrate. Signs of autonomic nervous system discharge are evident: dilated pupils (mydriasis); mild sweating (diaphoresis); sleep interference. Heart rate becomes elevated (>115 bpm). Diastolic blood pressure (lower number) rises above 100 mmHg. Patients find relief in laying down and not moving.   Severe pain 5 Intense and extremely unpleasant. Associated with frowning face and frequent crying. Pain overwhelms the senses.  Ability to do any activity or maintain social relationships becomes significantly limited. Conversation  becomes difficult. Pacing back and forth is common, as getting into a comfortable position is nearly impossible. Pain wakes you up from deep sleep. Physical signs will be obvious: pupillary dilation; increased sweating; goosebumps; brisk reflexes; cold, clammy hands and feet; nausea, vomiting or dry heaves; loss of appetite; significant sleep disturbance with inability to fall asleep or to remain asleep. When persistent, significant weight loss is observed due to the complete loss of appetite and sleep deprivation.  Blood pressure and heart rate becomes significantly elevated. Caution: If elevated blood pressure triggers a pounding headache associated with blurred vision, then the patient should immediately seek attention at an urgent or emergency care unit, as these may be signs of an impending stroke.    Emergency Department Pain Levels (6-10/10)  Emergency Room Pain 6 Severely limiting. Requires emergency care and should not be seen or managed at an outpatient pain management facility. Communication becomes difficult and requires great effort. Assistance to reach the emergency department may be required. Facial flushing and profuse sweating along with potentially dangerous increases in heart rate and blood pressure will be evident.   Distressing pain 7 Self-care is very difficult. Assistance is required to transport, or use restroom.  Assistance to reach the emergency department will be required. Tasks requiring coordination, such as bathing and getting dressed become very difficult.   Disabling pain 8 Self-care is no longer possible. At this level, pain is disabling. The individual is unable to do even the most "basic" activities such as walking, eating, bathing, dressing, transferring to a bed, or toileting. Fine motor skills are lost. It is difficult to think clearly.   Incapacitating pain 9 Pain becomes incapacitating. Thought processing is no longer possible. Difficult to remember your own name. Control of movement and coordination are lost.   The worst pain imaginable 10 At this level, most patients pass out from pain. When this level is reached, collapse of the autonomic nervous system occurs, leading to a sudden drop in blood pressure and heart rate. This in turn results in a temporary and dramatic drop in blood flow to the brain, leading to a loss of consciousness. Fainting is one of the body's self defense mechanisms. Passing out puts the brain in a calmed state and causes it to shut down for a while, in order to begin the healing process.    Summary: 1. Refer to this scale when providing [place] with your pain level. 2. Be accurate and careful when reporting your pain level. This will help with your care. 3. Over-reporting your pain level will lead to loss of credibility. 4. Even a level of 1/10 means that there is pain and will be treated at our facility. 5. High, inaccurate reporting will be documented as "Symptom Exaggeration", leading to loss of credibility and suspicions of possible secondary gains such as obtaining more narcotics, or wanting to appear disabled, for fraudulent reasons. 6. Only pain levels of 5 or below will be seen at our facility. 7. Pain levels of 6 and above will be sent to the Emergency Department and the appointment  cancelled. ____________________________________________________________________________________________   BMI Assessment: Estimated body mass index is 34.41 kg/m as calculated from the following:   Height as of this encounter: 5' 7.5" (1.715 m).   Weight as of this encounter: 223 lb (101.2 kg).  BMI interpretation table: BMI level Category Range association with higher incidence of chronic pain  <18 kg/m2 Underweight   18.5-24.9 kg/m2 Ideal body weight   25-29.9 kg/m2 Overweight Increased incidence  by 20%  30-34.9 kg/m2 Obese (Class I) Increased incidence by 68%  35-39.9 kg/m2 Severe obesity (Class II) Increased incidence by 136%  >40 kg/m2 Extreme obesity (Class III) Increased incidence by 254%   BMI Readings from Last 4 Encounters:  06/16/17 34.41 kg/m  11/21/16 33.33 kg/m   Wt Readings from Last 4 Encounters:  06/16/17 223 lb (101.2 kg)  11/21/16 216 lb (98 kg)

## 2017-06-16 NOTE — Patient Instructions (Addendum)
____________________________________________________________________________________________  Appointment Policy Summary  It is our goal and responsibility to provide the medical community with assistance in the evaluation and management of patients with chronic pain. Unfortunately our resources are limited. Because we do not have an unlimited amount of time, or available appointments, we are required to closely monitor and manage their use. The following rules exist to maximize their use:  Patient's responsibilities: 1. Punctuality:  At what time should I arrive? You should be physically present in our office 30 minutes before your scheduled appointment. Your scheduled appointment is with your assigned healthcare provider. However, it takes 5-10 minutes to be "checked-in", and another 15 minutes for the nurses to do the admission. If you arrive to our office at the time you were given for your appointment, you will end up being at least 20-25 minutes late to your appointment with the provider. 2. Tardiness:  What happens if I arrive only a few minutes after my scheduled appointment time? You will need to reschedule your appointment. The cutoff is your appointment time. This is why it is so important that you arrive at least 30 minutes before that appointment. If you have an appointment scheduled for 10:00 AM and you arrive at 10:01, you will be required to reschedule your appointment.  3. Plan ahead:  Always assume that you will encounter traffic on your way in. Plan for it. If you are dependent on a driver, make sure they understand these rules and the need to arrive early. 4. Other appointments and responsibilities:  Avoid scheduling any other appointments before or after your pain clinic appointments.  5. Be prepared:  Write down everything that you need to discuss with your healthcare provider and give this information to the admitting nurse. Write down the medications that you will need  refilled. Bring your pills and bottles (even the empty ones), to all of your appointments, except for those where a procedure is scheduled. 6. No children or pets:  Find someone to take care of them. It is not appropriate to bring them in. 7. Scheduling changes:  We request "advanced notification" of any changes or cancellations. 8. Advanced notification:  Defined as a time period of more than 24 hours prior to the originally scheduled appointment. This allows for the appointment to be offered to other patients. 9. Rescheduling:  When a visit is rescheduled, it will require the cancellation of the original appointment. For this reason they both fall within the category of "Cancellations".  10. Cancellations:  They require advanced notification. Any cancellation less than 24 hours before the  appointment will be recorded as a "No Show". 11. No Show:  Defined as an unkept appointment where the patient failed to notify or declare to the practice their intention or inability to keep the appointment.  Corrective process for repeat offenders:  1. Tardiness: Three (3) episodes of rescheduling due to late arrivals will be recorded as one (1) "No Show". 2. Cancellation or reschedule: Three (3) cancellations or rescheduling will be recorded as one (1) "No Show". 3. "No Shows": Three (3) "No Shows" within a 12 month period will result in discharge from the practice.  ____________________________________________________________________________________________  ____________________________________________________________________________________________  Pain Scale  Introduction: The pain score used by this practice is the Verbal Numerical Rating Scale (VNRS-11). This is an 11-point scale. It is for adults and children 10 years or older. There are significant differences in how the pain score is reported, used, and applied. Forget everything you learned in the past and  learn this scoring  system.  General Information: The scale should reflect your current level of pain. Unless you are specifically asked for the level of your worst pain, or your average pain. If you are asked for one of these two, then it should be understood that it is over the past 24 hours.  Basic Activities of Daily Living (ADL): Personal hygiene, dressing, eating, transferring, and using restroom.  Instructions: Most patients tend to report their level of pain as a combination of two factors, their physical pain and their psychosocial pain. This last one is also known as "suffering" and it is reflection of how physical pain affects you socially and psychologically. From now on, report them separately. From this point on, when asked to report your pain level, report only your physical pain. Use the following table for reference.  Pain Clinic Pain Levels (0-5/10)  Pain Level Score  Description  No Pain 0   Mild pain 1 Nagging, annoying, but does not interfere with basic activities of daily living (ADL). Patients are able to eat, bathe, get dressed, toileting (being able to get on and off the toilet and perform personal hygiene functions), transfer (move in and out of bed or a chair without assistance), and maintain continence (able to control bladder and bowel functions). Blood pressure and heart rate are unaffected. A normal heart rate for a healthy adult ranges from 60 to 100 bpm (beats per minute).   Mild to moderate pain 2 Noticeable and distracting. Impossible to hide from other people. More frequent flare-ups. Still possible to adapt and function close to normal. It can be very annoying and may have occasional stronger flare-ups. With discipline, patients may get used to it and adapt.   Moderate pain 3 Interferes significantly with activities of daily living (ADL). It becomes difficult to feed, bathe, get dressed, get on and off the toilet or to perform personal hygiene functions. Difficult to get in and out of  bed or a chair without assistance. Very distracting. With effort, it can be ignored when deeply involved in activities.   Moderately severe pain 4 Impossible to ignore for more than a few minutes. With effort, patients may still be able to manage work or participate in some social activities. Very difficult to concentrate. Signs of autonomic nervous system discharge are evident: dilated pupils (mydriasis); mild sweating (diaphoresis); sleep interference. Heart rate becomes elevated (>115 bpm). Diastolic blood pressure (lower number) rises above 100 mmHg. Patients find relief in laying down and not moving.   Severe pain 5 Intense and extremely unpleasant. Associated with frowning face and frequent crying. Pain overwhelms the senses.  Ability to do any activity or maintain social relationships becomes significantly limited. Conversation becomes difficult. Pacing back and forth is common, as getting into a comfortable position is nearly impossible. Pain wakes you up from deep sleep. Physical signs will be obvious: pupillary dilation; increased sweating; goosebumps; brisk reflexes; cold, clammy hands and feet; nausea, vomiting or dry heaves; loss of appetite; significant sleep disturbance with inability to fall asleep or to remain asleep. When persistent, significant weight loss is observed due to the complete loss of appetite and sleep deprivation.  Blood pressure and heart rate becomes significantly elevated. Caution: If elevated blood pressure triggers a pounding headache associated with blurred vision, then the patient should immediately seek attention at an urgent or emergency care unit, as these may be signs of an impending stroke.    Emergency Department Pain Levels (6-10/10)  Emergency Room Pain 6   Severely limiting. Requires emergency care and should not be seen or managed at an outpatient pain management facility. Communication becomes difficult and requires great effort. Assistance to reach the  emergency department may be required. Facial flushing and profuse sweating along with potentially dangerous increases in heart rate and blood pressure will be evident.   Distressing pain 7 Self-care is very difficult. Assistance is required to transport, or use restroom. Assistance to reach the emergency department will be required. Tasks requiring coordination, such as bathing and getting dressed become very difficult.   Disabling pain 8 Self-care is no longer possible. At this level, pain is disabling. The individual is unable to do even the most "basic" activities such as walking, eating, bathing, dressing, transferring to a bed, or toileting. Fine motor skills are lost. It is difficult to think clearly.   Incapacitating pain 9 Pain becomes incapacitating. Thought processing is no longer possible. Difficult to remember your own name. Control of movement and coordination are lost.   The worst pain imaginable 10 At this level, most patients pass out from pain. When this level is reached, collapse of the autonomic nervous system occurs, leading to a sudden drop in blood pressure and heart rate. This in turn results in a temporary and dramatic drop in blood flow to the brain, leading to a loss of consciousness. Fainting is one of the body's self defense mechanisms. Passing out puts the brain in a calmed state and causes it to shut down for a while, in order to begin the healing process.    Summary: 1. Refer to this scale when providing [place] with your pain level. 2. Be accurate and careful when reporting your pain level. This will help with your care. 3. Over-reporting your pain level will lead to loss of credibility. 4. Even a level of 1/10 means that there is pain and will be treated at our facility. 5. High, inaccurate reporting will be documented as "Symptom Exaggeration", leading to loss of credibility and suspicions of possible secondary gains such as obtaining more narcotics, or wanting to appear  disabled, for fraudulent reasons. 6. Only pain levels of 5 or below will be seen at our facility. 7. Pain levels of 6 and above will be sent to the Emergency Department and the appointment cancelled. ____________________________________________________________________________________________   BMI Assessment: Estimated body mass index is 34.41 kg/m as calculated from the following:   Height as of this encounter: 5' 7.5" (1.715 m).   Weight as of this encounter: 223 lb (101.2 kg).  BMI interpretation table: BMI level Category Range association with higher incidence of chronic pain  <18 kg/m2 Underweight   18.5-24.9 kg/m2 Ideal body weight   25-29.9 kg/m2 Overweight Increased incidence by 20%  30-34.9 kg/m2 Obese (Class I) Increased incidence by 68%  35-39.9 kg/m2 Severe obesity (Class II) Increased incidence by 136%  >40 kg/m2 Extreme obesity (Class III) Increased incidence by 254%   BMI Readings from Last 4 Encounters:  06/16/17 34.41 kg/m  11/21/16 33.33 kg/m   Wt Readings from Last 4 Encounters:  06/16/17 223 lb (101.2 kg)  11/21/16 216 lb (98 kg)

## 2017-06-16 NOTE — Progress Notes (Signed)
Safety precautions to be maintained throughout the outpatient stay will include: orient to surroundings, keep bed in low position, maintain call bell within reach at all times, provide assistance with transfer out of bed and ambulation.  

## 2017-06-21 LAB — COMPLIANCE DRUG ANALYSIS, UR

## 2017-07-07 DIAGNOSIS — E669 Obesity, unspecified: Secondary | ICD-10-CM | POA: Insufficient documentation

## 2017-07-07 DIAGNOSIS — M1712 Unilateral primary osteoarthritis, left knee: Secondary | ICD-10-CM | POA: Insufficient documentation

## 2017-08-18 DIAGNOSIS — M7918 Myalgia, other site: Secondary | ICD-10-CM

## 2017-08-18 DIAGNOSIS — M1712 Unilateral primary osteoarthritis, left knee: Secondary | ICD-10-CM | POA: Insufficient documentation

## 2017-08-18 DIAGNOSIS — Z79899 Other long term (current) drug therapy: Secondary | ICD-10-CM | POA: Insufficient documentation

## 2017-08-18 DIAGNOSIS — G8929 Other chronic pain: Secondary | ICD-10-CM | POA: Insufficient documentation

## 2017-08-18 DIAGNOSIS — M899 Disorder of bone, unspecified: Secondary | ICD-10-CM | POA: Insufficient documentation

## 2017-08-18 DIAGNOSIS — M150 Primary generalized (osteo)arthritis: Secondary | ICD-10-CM

## 2017-08-18 DIAGNOSIS — Z789 Other specified health status: Secondary | ICD-10-CM | POA: Insufficient documentation

## 2017-08-18 DIAGNOSIS — M159 Polyosteoarthritis, unspecified: Secondary | ICD-10-CM | POA: Insufficient documentation

## 2017-08-18 DIAGNOSIS — F1190 Opioid use, unspecified, uncomplicated: Secondary | ICD-10-CM | POA: Insufficient documentation

## 2017-08-18 DIAGNOSIS — M792 Neuralgia and neuritis, unspecified: Secondary | ICD-10-CM | POA: Insufficient documentation

## 2017-08-18 NOTE — Progress Notes (Signed)
Patient's Name: Nancy Day  MRN: [id number]  Referring Provider: Idelle Crouch, MD  DOB: 03/23/1951  PCP: Idelle Crouch, MD  DOS: 08/19/2017  Note by: Gaspar Cola, MD  Service setting: Ambulatory outpatient  Specialty: Interventional Pain Management  Location: [facility] (AMB) Pain Management Facility    Patient type: Established   Primary Reason(s) for Visit: Encounter for evaluation before starting new chronic pain management plan of care (Level of risk: moderate) CC: Knee Pain (all over the body) and Pain (all over the body, fibromyalgia)  HPI  Ms. Gronewold is a 66 y.o. year old, female patient, who comes today for a follow-up evaluation to review the test results and decide on a treatment plan. She has Anxiety state; Anxiety; Benign essential HTN; Breast cancer ([place]) (1999); CAD (coronary artery disease); Coronary atherosclerosis; DDD (degenerative disc disease), lumbar; DDD (degenerative disc disease), lumbosacral; Depression; Depressive disorder; Dizziness; Encounter for long-term (current) use of other medications; Essential hypertension; Fibromyalgia; Hyperlipidemia; Mixed hyperlipidemia; Vitamin D deficiency, unspecified; Long term current use of opiate analgesic; Chronic pain syndrome; Chronic neck pain (Tertiary Area of Pain) (Bilateral) (R>L); Chronic knee pain (Primary Area of Pain) (Left); Chronic low back pain (Fifth Area of Pain) (Bilateral) (L>R); Chronic sacroiliac joint pain (Bilateral) (L>R); Chronic shoulder pain (Fourth Area of Pain) (Bilateral) (R>L); Chronic lower extremity pain (Secondary Area of Pain) (Bilateral) (L>R); Obesity (BMI 30.0-34.9); Osteoarthritis of knee (Left); Chronic musculoskeletal pain; Tricompartment osteoarthritis of knee (Left); Disorder of skeletal system; Pharmacologic therapy; Problems influencing health status; Opiate use; Osteoarthritis; Neurogenic pain; History of rotator cuff surgery (R); Abnormal MRI, lumbar spine (10/01/09); Lumbar foraminal  stenosis (Left: L5-S1); Lumbar facet arthropathy (Bilateral); Lumbar facet osteoarthritis (Bilateral); Lumbar facet syndrome (Bilateral) (L>R); and Lumbar spinal stenosis (L3-4) on her problem list. Her primarily concern today is the Knee Pain (all over the body) and Pain (all over the body, fibromyalgia)  Pain Assessment: Location: Left Knee Radiating: just the knee; patient states "has water in it" Onset: More than a month ago Duration: Chronic pain Quality: Aching, Constant, Sharp, Shooting, Tender Severity: 8  (has body pain all over d/t fibromyalgia)/10 (self-reported pain score)  Note: Reported level is inconsistent with clinical observations. Clinically the patient looks like a 2/10 A 2/10 is viewed as "Mild to Moderate" and described as noticeable and distracting. Impossible to hide from other people. More frequent flare-ups. Still possible to adapt and function close to normal. It can be very annoying and may have occasional stronger flare-ups. With discipline, patients may get used to it and adapt. Information on the proper use of the pain scale provided to the patient today. When using our objective Pain Scale, levels between 6 and 10/10 are said to belong in an emergency room, as it progressively worsens from a 6/10, described as severely limiting, requiring emergency care not usually available at an outpatient pain management facility. At a 6/10 level, communication becomes difficult and requires great effort. Assistance to reach the emergency department may be required. Facial flushing and profuse sweating along with potentially dangerous increases in heart rate and blood pressure will be evident. Effect on ADL: pace self Timing: Constant Modifying factors: tried vicks vapor rub,   Ms. Dingee comes in today for a follow-up visit after her initial evaluation on 06/16/2017. Today we went over the results of her tests. These were explained in "Layman's terms". During today's appointment we  went over my diagnostic impression, as well as the proposed treatment plan.  According to the patient she has  generalized pain secondary to fibromyalgia. She admits she was diagnosed in 1. She admits that she has failed Lyrica and gabapentin. She has previously been on tramadol which she has stopped.  Her second area of pain is in her lower back. She admits that she's been having back pain for 10-15 years. She denies any precipitating factors she denies any previous surgeries. She has had epidural steroid injections in the past from [facility]; they were effective for approximately one week. She states that over time the efficacy continue to decrease that she stopped the injections. She denies any recent physical therapy or images.  Her third area of pain is in her lower extremities she admits the left is greater than the right. She has radicular symptoms that goes down to her knee. She does have some numbness tingling with recent weakness.   Her fourth area of pain is in her left knee. She is having swelling and weakness. She denies any previous surgery, interventional therapy, physical therapy. She did have recent x-rays at [facility] clinic.  She admits that she is having constant bilateral shoulder pain. with right being greater than left. She has had history of rotator cuff repair on the right 2017. She denies any numbness tingling does have some limitations.  Her last area of pain is in her neck with right being greater than left The pain goes down into her shoulders. She denies any numbness tingling in her upper extremities. She denies any previous surgery, interventional therapy, physical therapy or recent images.   In considering the treatment plan options, Ms. Burkholder was reminded that I no longer take patients for medication management only. I asked her to let me know if she had no intention of taking advantage of the interventional therapies, so that we could make arrangements to  provide this space to someone interested. I also made it clear that undergoing interventional therapies for the purpose of getting pain medications is very inappropriate on the part of a patient, and it will not be tolerated in this practice. This type of behavior would suggest true addiction and therefore it requires referral to an addiction specialist.   Unfortunately, the patient did not complete all of the orders that we have put in during her initial evaluation and therefore we were unable to complete our evaluation. Today we have reorder some of these tests and we have explained to the patient importance of completing that before we can move on to providing her with a clear treatment plan. She understood and accepted. Because of the same reason, today we have not taken over her medication regiment.  Further details on both, my assessment(s), as well as the proposed treatment plan, please see below.  Controlled Substance Pharmacotherapy Assessment REMS (Risk Evaluation and Mitigation Strategy)  Analgesic: Tramadol 50 mg 3 times daily (tramadol 150 mg per day) Highest recorded MME/day: 48 mg/day MME/day: 20 mg/day Pill Count: None expected due to no prior prescriptions written by our practice. Lona Millard, RN  08/19/2017 10:09 AM  Sign at close encounter Safety precautions to be maintained throughout the outpatient stay will include: orient to surroundings, keep bed in low position, maintain call bell within reach at all times, provide assistance with transfer out of bed and ambulation.    Pharmacokinetics: Liberation and absorption (onset of action): WNL Distribution (time to peak effect): WNL Metabolism and excretion (duration of action): WNL         Pharmacodynamics: Desired effects: Analgesia: Ms. Ennen reports >50% benefit. Functional  ability: Patient reports that medication allows her to accomplish basic ADLs Clinically meaningful improvement in function (CMIF): Sustained CMIF  goals met Perceived effectiveness: Described as relatively effective, allowing for increase in activities of daily living (ADL) Undesirable effects: Side-effects or Adverse reactions: None reported Monitoring: [place] PMP: Online review of the past 51-monthperiod previously conducted. Not applicable at this point since we have not taken over the patient's medication management yet. List of all Serum Drug Screening Test(s):  No results found for: AMPHSCRSER, BARBSCRSER, BENZOSCRSER, COCAINSCRSER, PCPSCRSER, THCSCRSER, OPIATESCRSER, [place] of all UDS test(s) done:  Lab Results  Component Value Date   SUMMARY FINAL 06/16/2017   Last UDS on record: Summary  Date Value Ref Range Status  06/16/2017 FINAL  Final    Comment:    ==================================================================== TOXASSURE COMP DRUG ANALYSIS,UR ==================================================================== Test                             Result       Flag       Units Drug Present and Declared for Prescription Verification   Tramadol                       68           EXPECTED   ng/mg creat   N-Desmethyltramadol            229          EXPECTED   ng/mg creat    Source of tramadol is a prescription medication.    N-desmethyltramadol is an expected metabolite of tramadol.   Pregabalin                     PRESENT      EXPECTED   Cyclobenzaprine                PRESENT      EXPECTED   Desmethylcyclobenzaprine       PRESENT      EXPECTED    Desmethylcyclobenzaprine is an expected metabolite of    cyclobenzaprine.   Zolpidem Acid                  PRESENT      EXPECTED    Zolpidem acid is an expected metabolite of zolpidem.   Acetaminophen                  PRESENT      EXPECTED   Diltiazem                      PRESENT      EXPECTED Drug Absent but Declared for Prescription Verification   Duloxetine                     Not Detected UNEXPECTED   Salicylate                     Not Detected  UNEXPECTED    Aspirin, as indicated in the declared medication list, is not    always detected even when used as directed.   Lidocaine                      Not Detected UNEXPECTED    Lidocaine, as indicated in the declared medication list, is not    always detected even when used as directed. ==================================================================== Test  Result    Flag   Units      Ref Range   Creatinine              167              mg/dL      >=20 ==================================================================== Declared Medications:  The flagging and interpretation on this report are based on the  following declared medications.  Unexpected results may arise from  inaccuracies in the declared medications.  **Note: The testing scope of this panel includes these medications:  Cyclobenzaprine  Diltiazem  Duloxetine  Pregabalin  Tramadol  **Note: The testing scope of this panel does not include small to  moderate amounts of these reported medications:  Acetaminophen  Aspirin (Aspirin 81)  Lidocaine  Zolpidem  **Note: The testing scope of this panel does not include following  reported medications:  Cholecalciferol  Cyanocobalamin  Herbal Product  Hydrochlorothiazide (Losartan-HCTZ)  Losartan (Losartan-HCTZ)  Niacin  Omeprazole  Potassium  Rosuvastatin  Supplement  Turmeric  Ubiquinone (Coenzyme Q 10) ==================================================================== For clinical consultation, please call [phone]. ====================================================================    UDS interpretation: No unexpected findings.          Medication Assessment Form: Patient introduced to form today Treatment compliance: Treatment may start today if patient agrees with proposed plan. Evaluation of compliance is not applicable at this point Risk Assessment Profile: Aberrant behavior: See initial evaluations. None observed or  detected today Comorbid factors increasing risk of overdose: See initial evaluation. No additional risks detected today Medical Psychology Evaluation: Please see scanned results in medical record.     Opioid Risk Tool - 08/19/17 1007      Family History of Substance Abuse   Alcohol Negative   Illegal Drugs Negative   Rx Drugs Negative     Personal History of Substance Abuse   Alcohol Negative   Illegal Drugs Negative   Rx Drugs Negative     Age   Age between 33-45 years  No     History of Preadolescent Sexual Abuse   History of Preadolescent Sexual Abuse Negative or Female     Psychological Disease   Psychological Disease Negative   Depression Positive  d/t the pain , on medication     Total Score   Opioid Risk Tool Scoring 1   Opioid Risk Interpretation Low Risk     ORT Scoring interpretation table:  Score <3 = Low Risk for SUD  Score between 4-7 = Moderate Risk for SUD  Score >8 = High Risk for Opioid Abuse   Risk Mitigation Strategies:  Patient opioid safety counseling: Completed today. Counseling provided to patient as per "Patient Counseling Document". Document signed by patient, attesting to counseling and understanding Patient-Prescriber Agreement (PPA): Obtained today.  Controlled substance notification to other providers: Written and sent today.  Pharmacologic Plan: The patient did not complete all of the testing that we had ordered on the initial evaluation and therefore we do not have sufficient information to complete our evaluation. This is a same reason, today we will not be taken over her medication regiment.             Laboratory Chemistry   Note: Results seen under [facility] system at "Care Everywhere". WNL Unfortunately, it did not have all the information that we needed.  Recent Diagnostic Imaging Review   Complexity Note: No results found under the [facility] electronic medical record.  Meds   Current Outpatient  Prescriptions:  .  aspirin EC 81 MG tablet, Take 81 mg by mouth daily., Disp: , Rfl:  .  BIOTIN PO, Take 15,000 mcg by mouth daily at 2 PM., Disp: , Rfl:  .  Cholecalciferol (VITAMIN D3) 1000 units CAPS, Take by mouth daily at 6 (six) AM. , Disp: , Rfl:  .  Cinnamon 500 MG capsule, Take 500 mg by mouth daily. , Disp: , Rfl:  .  Coenzyme Q-10 200 MG CAPS, Take by mouth daily. , Disp: , Rfl:  .  Cyanocobalamin (B-12) 5000 MCG SUBL, Place under the tongue daily at 6 (six) AM. , Disp: , Rfl:  .  cyclobenzaprine (FLEXERIL) 10 MG tablet, Take 10 mg by mouth as needed for muscle spasms. , Disp: , Rfl:  .  diltiazem (CARDIZEM CD) 360 MG 24 hr capsule, Take 360 mg by mouth daily. , Disp: , Rfl:  .  DULoxetine (CYMBALTA) 30 MG capsule, Take 30 mg by mouth 2 (two) times daily., Disp: , Rfl:  .  Garlic 4403 MG CAPS, Take by mouth daily at 2 PM., Disp: , Rfl:  .  lidocaine (XYLOCAINE) 2 % jelly, Apply topically as needed. , Disp: , Rfl:  .  losartan-hydrochlorothiazide (HYZAAR) 100-12.5 MG tablet, Take 1 tablet by mouth daily. , Disp: , Rfl:  .  niacin (NIASPAN) 1000 MG CR tablet, Take 1,000 mg by mouth at bedtime., Disp: , Rfl:  .  potassium chloride (K-DUR) 10 MEQ tablet, Take 10 mEq by mouth 2 (two) times daily., Disp: , Rfl:  .  rosuvastatin (CRESTOR) 20 MG tablet, Take 20 mg by mouth daily., Disp: , Rfl:  .  Turmeric POWD, by Does not apply route every morning., Disp: , Rfl:  .  zolpidem (AMBIEN) 10 MG tablet, Take 5 mg by mouth at bedtime as needed for sleep., Disp: , Rfl:  .  acetaminophen (TYLENOL) 500 MG tablet, Take by mouth., Disp: , Rfl:   ROS  Constitutional: Denies any fever or chills Gastrointestinal: No reported hemesis, hematochezia, vomiting, or acute GI distress Musculoskeletal: Denies any acute onset joint swelling, redness, loss of ROM, or weakness Neurological: No reported episodes of acute onset apraxia, aphasia, dysarthria, agnosia, amnesia, paralysis, loss of coordination, or loss  of consciousness  Allergies  Ms. Geil is allergic to codeine.  [place]  Drug: Ms. Wignall  reports that she does not use drugs. Alcohol:  reports that she drinks alcohol. Tobacco:  reports that she has quit smoking. She has never used smokeless tobacco. Medical:  has a past medical history of Allergy; Anxiety; Breast cancer ([place]) (1998); Coronary artery disease; DDD (degenerative disc disease), lumbar; Depression; Dyspnea; Fibromyalgia; GERD (gastroesophageal reflux disease); Hypertension; Spinal stenosis; and Vitamin D deficiency. Surgical: Ms. Kozma  has a past surgical history that includes Augmentation mammaplasty (Right, 2012); Mastectomy (Right, 1998); Breast biopsy (Right, 1998); Abdominal hysterectomy; Shoulder surgery; Toe Surgery; and Colonoscopy with propofol (N/A, 11/21/2016). Family: family history is not on file.  Constitutional Exam  General appearance: Well nourished, well developed, and well hydrated. In no apparent acute distress Vitals:   08/19/17 0937  BP: 133/78  Pulse: 98  Resp: 16  Temp: 98.4 F (36.9 C)  SpO2: 100%  Weight: 220 lb (99.8 kg)  Height: 5' 7" (1.702 m)   BMI Assessment: Estimated body mass index is 34.46 kg/m as calculated from the following:   Height as of this encounter: 5' 7" (1.702 m).   Weight as of this encounter:  220 lb (99.8 kg).  BMI interpretation table: BMI level Category Range association with higher incidence of chronic pain  <18 kg/m2 Underweight   18.5-24.9 kg/m2 Ideal body weight   25-29.9 kg/m2 Overweight Increased incidence by 20%  30-34.9 kg/m2 Obese (Class I) Increased incidence by 68%  35-39.9 kg/m2 Severe obesity (Class II) Increased incidence by 136%  >40 kg/m2 Extreme obesity (Class III) Increased incidence by 254%   BMI Readings from Last 4 Encounters:  08/19/17 34.46 kg/m  06/16/17 34.41 kg/m  11/21/16 33.33 kg/m   Wt Readings from Last 4 Encounters:  08/19/17 220 lb (99.8 kg)  06/16/17 223 lb (101.2 kg)   11/21/16 216 lb (98 kg)  Psych/Mental status: Alert, oriented x 3 (person, place, & time)       Eyes: PERLA Respiratory: No evidence of acute respiratory distress  Cervical Spine Area Exam  Skin & Axial Inspection: No masses, redness, edema, swelling, or associated skin lesions Alignment: Symmetrical Functional ROM: Unrestricted ROM      Stability: No instability detected Muscle Tone/Strength: Functionally intact. No obvious neuro-muscular anomalies detected. Sensory (Neurological): Unimpaired Palpation: No palpable anomalies              Upper Extremity (UE) Exam    Side: Right upper extremity  Side: Left upper extremity  Skin & Extremity Inspection: Skin color, temperature, and hair growth are WNL. No peripheral edema or cyanosis. No masses, redness, swelling, asymmetry, or associated skin lesions. No contractures.  Skin & Extremity Inspection: Skin color, temperature, and hair growth are WNL. No peripheral edema or cyanosis. No masses, redness, swelling, asymmetry, or associated skin lesions. No contractures.  Functional ROM: Unrestricted ROM          Functional ROM: Unrestricted ROM          Muscle Tone/Strength: Functionally intact. No obvious neuro-muscular anomalies detected.  Muscle Tone/Strength: Functionally intact. No obvious neuro-muscular anomalies detected.  Sensory (Neurological): Unimpaired          Sensory (Neurological): Unimpaired          Palpation: No palpable anomalies              Palpation: No palpable anomalies              Specialized Test(s): Deferred         Specialized Test(s): Deferred          Thoracic Spine Area Exam  Skin & Axial Inspection: No masses, redness, or swelling Alignment: Symmetrical Functional ROM: Unrestricted ROM Stability: No instability detected Muscle Tone/Strength: Functionally intact. No obvious neuro-muscular anomalies detected. Sensory (Neurological): Unimpaired Muscle strength & Tone: No palpable anomalies  Lumbar Spine Area  Exam  Skin & Axial Inspection: No masses, redness, or swelling Alignment: Symmetrical Functional ROM: Diminished ROM      Stability: No instability detected Muscle Tone/Strength: Functionally intact. No obvious neuro-muscular anomalies detected. Sensory (Neurological): Movement-associated discomfort Palpation: Complains of area being tender to palpation       Provocative Tests: Lumbar Hyperextension and rotation test: evaluation deferred today       Lumbar Lateral bending test: evaluation deferred today       Patrick's Maneuver: evaluation deferred today                    Gait & Posture Assessment  Ambulation: Unassisted Gait: Relatively normal for age and body habitus Posture: WNL   Lower Extremity Exam    Side: Right lower extremity  Side: Left lower extremity  Skin &  Extremity Inspection: Skin color, temperature, and hair growth are WNL. No peripheral edema or cyanosis. No masses, redness, swelling, asymmetry, or associated skin lesions. No contractures.  Skin & Extremity Inspection: Skin color, temperature, and hair growth are WNL. No peripheral edema or cyanosis. No masses, redness, swelling, asymmetry, or associated skin lesions. No contractures.  Functional ROM: Unrestricted ROM          Functional ROM: Unrestricted ROM          Muscle Tone/Strength: Functionally intact. No obvious neuro-muscular anomalies detected.  Muscle Tone/Strength: Functionally intact. No obvious neuro-muscular anomalies detected.  Sensory (Neurological): Unimpaired  Sensory (Neurological): Unimpaired  Palpation: No palpable anomalies  Palpation: No palpable anomalies   Assessment & Plan  Primary Diagnosis & Pertinent Problem List: The primary encounter diagnosis was Chronic pain syndrome. Diagnoses of Chronic knee pain (Primary Area of Pain) (Left), Osteoarthritis of knee (Left), Tricompartment osteoarthritis of knee (Left), Chronic lower extremity pain (Secondary Area of Pain) (Bilateral) (L>R), Lumbar  spinal stenosis (L3-4), Lumbar foraminal stenosis (Left: L5-S1), Chronic neck pain (Tertiary Area of Pain) (Bilateral) (R>L), Chronic shoulder pain (Fourth Area of Pain) (Bilateral) (R>L), History of rotator cuff surgery (R), Chronic low back pain (Fifth Area of Pain) (Bilateral) (L>R), Lumbar facet syndrome (Bilateral) (L>R), Lumbar facet osteoarthritis (Bilateral), Lumbar facet arthropathy (Bilateral), Chronic sacroiliac joint pain (Bilateral) (L>R), Fibromyalgia, Chronic musculoskeletal pain, Osteoarthritis, Disorder of skeletal system, Pharmacologic therapy, Opiate use, Abnormal MRI, lumbar spine (10/01/09), and Problems influencing health status were also pertinent to this visit.  Visit Diagnosis: 1. Chronic pain syndrome   2. Chronic knee pain (Primary Area of Pain) (Left)   3. Osteoarthritis of knee (Left)   4. Tricompartment osteoarthritis of knee (Left)   5. Chronic lower extremity pain (Secondary Area of Pain) (Bilateral) (L>R)   6. Lumbar spinal stenosis (L3-4)   7. Lumbar foraminal stenosis (Left: L5-S1)   8. Chronic neck pain (Tertiary Area of Pain) (Bilateral) (R>L)   9. Chronic shoulder pain (Fourth Area of Pain) (Bilateral) (R>L)   10. History of rotator cuff surgery (R)   11. Chronic low back pain (Fifth Area of Pain) (Bilateral) (L>R)   12. Lumbar facet syndrome (Bilateral) (L>R)   13. Lumbar facet osteoarthritis (Bilateral)   14. Lumbar facet arthropathy (Bilateral)   15. Chronic sacroiliac joint pain (Bilateral) (L>R)   16. Fibromyalgia   17. Chronic musculoskeletal pain   18. Osteoarthritis   19. Disorder of skeletal system   20. Pharmacologic therapy   21. Opiate use   22. Abnormal MRI, lumbar spine (10/01/09)   23. Problems influencing health status    Problems updated and reviewed during this visit: Problem  History of rotator cuff surgery (R)  Abnormal MRI, lumbar spine (10/01/09)   MRI of the lumbar spine  Comparison: none  Indication: Pain with left  leg  DOB: 12/04/1950  Technique: Lumbar HNP protocol MRI sequences of the lumbar spine were obtained without contrast.  Findings: Conus not low-lying. Normal alignment. Disc desiccation L2-L3 and L5-S1 with mild discogenic reactive changes at L5-S1. Normal bone marrow. No compression deformities.  T11-T12, T12-L1, L1-L2:Evaluated on sagittal images only.No significant disc bulge, canal stenosis, or neuroforaminal narrowing.  L2-3: Mild circumferential disc bulge with minimal mass effect on the ventral thecal sac. No canal stenosis or foraminal stenosis. Mild bilateral facet arthropathy. L3-4: Mild circumferential disc bulge mildly impresses the ventral thecal sac. In combination with moderate bilateral facet arthropathy and ligamentum flavum buckling there is mild canal stenosis. No foraminal stenosis.Complex right  extraspinal synovial cyst. L4-5: Mild circumferential disc bulge minimally impresses ventral thecal sac. No canal stenosis or foraminal stenosis. Moderate bilateral facet arthropathy and ligamentum flavum buckling. L5-S1: Mild broad-based disc bulge and superimposed left foraminal protrusion, mildly narrows the left foramen. No canal stenosis. Moderate bilateral facet arthropathy. Normal right foramen. SI joints: Within normal limits.   Impression: Multilevel degenerative disc and facet disease, most notable at L3-L4 where there is mild canal stenosis; and L5-S1 where there is mild left foraminal stenosis secondary to small disc protrusion.   Lumbar foraminal stenosis (Left: L5-S1)  Lumbar facet arthropathy (Bilateral)  Lumbar facet osteoarthritis (Bilateral)  Lumbar facet syndrome (Bilateral) (L>R)  Lumbar spinal stenosis (L3-4)  Chronic neck pain (Tertiary Area of Pain) (Bilateral) (R>L)  Chronic knee pain (Primary Area of Pain) (Left)  Chronic low back pain (Fifth Area of Pain) (Bilateral) (L>R)  Chronic shoulder pain (Fourth Area of Pain) (Bilateral) (R>L)   Chronic lower extremity pain (Secondary Area of Pain) (Bilateral) (L>R)  Breast cancer (HCC) (1999)   Followed yearly w/ mammogram by Dr. Doy Hutching. Right mastectomy. Chemotherapy x4 done. No XRT.     Plan of Care  Pharmacotherapy (Medications Ordered): No orders of the defined types were placed in this encounter.  Procedure Orders    No procedure(s) ordered today    Lab Orders     Magnesium     25-Hydroxyvitamin D Lcms D2+D3     C-reactive protein     Vitamin B12  Imaging Orders     DG Shoulder Right     DG Shoulder Left Referral Orders  No referral(s) requested today    Pharmacological management options:  Opioid Analgesics: We'll take over management today. See above orders Membrane stabilizer: We have discussed the possibility of optimizing this mode of therapy, if tolerated Muscle relaxant: We have discussed the possibility of a trial NSAID: We have discussed the possibility of a trial Other analgesic(s): To be determined at a later time   Interventional management options: Planned, scheduled, and/or pending:    None at this time    Considering:   Diagnostic bilateral lumbar facet block  Possible bilateral lumbar facet RFA  Diagnostic bilateral SI joint block  Possible bilateral sacroiliac joint RFA  Diagnostic left sided lumbar epidural steroid injection  Diagnostic bilateral transforaminal epidural steroid injection  Diagnostic left intra-articular knee joint injection  Diagnostic left genicular nerve block  Possible left genicular nerve RFA  Possible left series of 5 intra-articular Hyalgan knee injections  Diagnostic bilateral intra-articular shoulder joint injection  Diagnostic bilateral suprascapular nerve block  Possible bilateral suprascapular nerve RFA  Diagnostic right-sided cervical epidural steroid injection  Diagnostic bilateral cervical facet block  Possible bilateral cervical facet RFA  Diagnostic IV lidocaine infusion    PRN Procedures:    None at this time   Provider-requested follow-up: Return in about 2 weeks (around 09/02/2017) for 2nd Visit w/ Dr. Dossie Arbour after test completion.  Future Appointments Date Time Provider [place]  09/02/2017 10:30 AM Milinda Pointer, MD [facility] None    Primary Care Physician: Idelle Crouch, MD Location: [facility] Note by: Gaspar Cola, MD Date: 08/19/2017; Time: 1:06 PM

## 2017-08-19 ENCOUNTER — Ambulatory Visit
Admission: RE | Admit: 2017-08-19 | Discharge: 2017-08-19 | Disposition: A | Discharge status: Home / Self Care | Payer: Medicare Other | Source: Ambulatory Visit | Attending: Pain Medicine | Admitting: Pain Medicine

## 2017-08-19 ENCOUNTER — Encounter: Payer: Self-pay | Admitting: Pain Medicine

## 2017-08-19 ENCOUNTER — Ambulatory Visit: Payer: Medicare Other | Attending: Pain Medicine | Admitting: Pain Medicine

## 2017-08-19 ENCOUNTER — Ambulatory Visit: Admission: RE | Admit: 2017-08-19 | Payer: Medicare Other | Source: Ambulatory Visit

## 2017-08-19 VITALS — BP 133/78 | HR 98 | Temp 98.4°F | Resp 16 | Ht 67.0 in | Wt 220.0 lb

## 2017-08-19 DIAGNOSIS — F1190 Opioid use, unspecified, uncomplicated: Secondary | ICD-10-CM

## 2017-08-19 DIAGNOSIS — Z885 Allergy status to narcotic agent status: Secondary | ICD-10-CM | POA: Insufficient documentation

## 2017-08-19 DIAGNOSIS — Z87891 Personal history of nicotine dependence: Secondary | ICD-10-CM | POA: Insufficient documentation

## 2017-08-19 DIAGNOSIS — M79604 Pain in right leg: Secondary | ICD-10-CM

## 2017-08-19 DIAGNOSIS — R937 Abnormal findings on diagnostic imaging of other parts of musculoskeletal system: Secondary | ICD-10-CM | POA: Insufficient documentation

## 2017-08-19 DIAGNOSIS — M25562 Pain in left knee: Secondary | ICD-10-CM | POA: Insufficient documentation

## 2017-08-19 DIAGNOSIS — M25511 Pain in right shoulder: Secondary | ICD-10-CM | POA: Insufficient documentation

## 2017-08-19 DIAGNOSIS — M1712 Unilateral primary osteoarthritis, left knee: Secondary | ICD-10-CM | POA: Diagnosis not present

## 2017-08-19 DIAGNOSIS — M488X6 Other specified spondylopathies, lumbar region: Secondary | ICD-10-CM | POA: Insufficient documentation

## 2017-08-19 DIAGNOSIS — E669 Obesity, unspecified: Secondary | ICD-10-CM | POA: Diagnosis not present

## 2017-08-19 DIAGNOSIS — M5136 Other intervertebral disc degeneration, lumbar region: Secondary | ICD-10-CM | POA: Insufficient documentation

## 2017-08-19 DIAGNOSIS — Z9011 Acquired absence of right breast and nipple: Secondary | ICD-10-CM | POA: Insufficient documentation

## 2017-08-19 DIAGNOSIS — M5441 Lumbago with sciatica, right side: Secondary | ICD-10-CM

## 2017-08-19 DIAGNOSIS — M47816 Spondylosis without myelopathy or radiculopathy, lumbar region: Secondary | ICD-10-CM | POA: Insufficient documentation

## 2017-08-19 DIAGNOSIS — G8929 Other chronic pain: Secondary | ICD-10-CM

## 2017-08-19 DIAGNOSIS — K219 Gastro-esophageal reflux disease without esophagitis: Secondary | ICD-10-CM | POA: Diagnosis not present

## 2017-08-19 DIAGNOSIS — Z9889 Other specified postprocedural states: Secondary | ICD-10-CM | POA: Diagnosis not present

## 2017-08-19 DIAGNOSIS — M159 Polyosteoarthritis, unspecified: Secondary | ICD-10-CM

## 2017-08-19 DIAGNOSIS — Z789 Other specified health status: Secondary | ICD-10-CM | POA: Diagnosis not present

## 2017-08-19 DIAGNOSIS — M25569 Pain in unspecified knee: Secondary | ICD-10-CM | POA: Diagnosis present

## 2017-08-19 DIAGNOSIS — G894 Chronic pain syndrome: Secondary | ICD-10-CM | POA: Diagnosis not present

## 2017-08-19 DIAGNOSIS — M25512 Pain in left shoulder: Secondary | ICD-10-CM

## 2017-08-19 DIAGNOSIS — M542 Cervicalgia: Secondary | ICD-10-CM | POA: Diagnosis not present

## 2017-08-19 DIAGNOSIS — E782 Mixed hyperlipidemia: Secondary | ICD-10-CM | POA: Insufficient documentation

## 2017-08-19 DIAGNOSIS — M5442 Lumbago with sciatica, left side: Secondary | ICD-10-CM | POA: Diagnosis not present

## 2017-08-19 DIAGNOSIS — M533 Sacrococcygeal disorders, not elsewhere classified: Secondary | ICD-10-CM | POA: Diagnosis not present

## 2017-08-19 DIAGNOSIS — F411 Generalized anxiety disorder: Secondary | ICD-10-CM | POA: Insufficient documentation

## 2017-08-19 DIAGNOSIS — I2510 Atherosclerotic heart disease of native coronary artery without angina pectoris: Secondary | ICD-10-CM | POA: Diagnosis not present

## 2017-08-19 DIAGNOSIS — M48061 Spinal stenosis, lumbar region without neurogenic claudication: Secondary | ICD-10-CM | POA: Diagnosis not present

## 2017-08-19 DIAGNOSIS — E559 Vitamin D deficiency, unspecified: Secondary | ICD-10-CM | POA: Insufficient documentation

## 2017-08-19 DIAGNOSIS — M19011 Primary osteoarthritis, right shoulder: Secondary | ICD-10-CM | POA: Insufficient documentation

## 2017-08-19 DIAGNOSIS — Z853 Personal history of malignant neoplasm of breast: Secondary | ICD-10-CM | POA: Diagnosis not present

## 2017-08-19 DIAGNOSIS — M797 Fibromyalgia: Secondary | ICD-10-CM | POA: Diagnosis not present

## 2017-08-19 DIAGNOSIS — M150 Primary generalized (osteo)arthritis: Secondary | ICD-10-CM

## 2017-08-19 DIAGNOSIS — Z79899 Other long term (current) drug therapy: Secondary | ICD-10-CM | POA: Diagnosis not present

## 2017-08-19 DIAGNOSIS — Z79891 Long term (current) use of opiate analgesic: Secondary | ICD-10-CM | POA: Diagnosis not present

## 2017-08-19 DIAGNOSIS — Z90710 Acquired absence of both cervix and uterus: Secondary | ICD-10-CM | POA: Insufficient documentation

## 2017-08-19 DIAGNOSIS — M899 Disorder of bone, unspecified: Secondary | ICD-10-CM

## 2017-08-19 DIAGNOSIS — F329 Major depressive disorder, single episode, unspecified: Secondary | ICD-10-CM | POA: Insufficient documentation

## 2017-08-19 DIAGNOSIS — Z7982 Long term (current) use of aspirin: Secondary | ICD-10-CM | POA: Diagnosis not present

## 2017-08-19 DIAGNOSIS — I10 Essential (primary) hypertension: Secondary | ICD-10-CM | POA: Insufficient documentation

## 2017-08-19 DIAGNOSIS — M7918 Myalgia, other site: Secondary | ICD-10-CM | POA: Diagnosis not present

## 2017-08-19 DIAGNOSIS — M4807 Spinal stenosis, lumbosacral region: Secondary | ICD-10-CM | POA: Insufficient documentation

## 2017-08-19 DIAGNOSIS — M9983 Other biomechanical lesions of lumbar region: Secondary | ICD-10-CM

## 2017-08-19 DIAGNOSIS — M19012 Primary osteoarthritis, left shoulder: Secondary | ICD-10-CM | POA: Diagnosis not present

## 2017-08-19 DIAGNOSIS — Z6834 Body mass index (BMI) 34.0-34.9, adult: Secondary | ICD-10-CM | POA: Insufficient documentation

## 2017-08-19 DIAGNOSIS — M79605 Pain in left leg: Secondary | ICD-10-CM

## 2017-08-19 NOTE — Progress Notes (Signed)
Safety precautions to be maintained throughout the outpatient stay will include: orient to surroundings, keep bed in low position, maintain call bell within reach at all times, provide assistance with transfer out of bed and ambulation.  

## 2017-08-19 NOTE — Patient Instructions (Signed)

## 2017-08-21 ENCOUNTER — Other Ambulatory Visit: Payer: Self-pay | Admitting: Internal Medicine

## 2017-08-21 DIAGNOSIS — Z1231 Encounter for screening mammogram for malignant neoplasm of breast: Secondary | ICD-10-CM

## 2017-08-25 LAB — 25-HYDROXY VITAMIN D LCMS D2+D3
25-Hydroxy, Vitamin D-3: 32 ng/mL
25-Hydroxy, Vitamin D: 34 ng/mL

## 2017-08-25 LAB — MAGNESIUM: Magnesium: 2 mg/dL (ref 1.6–2.3)

## 2017-08-25 LAB — 25-HYDROXYVITAMIN D LCMS D2+D3: 25-HYDROXY, VITAMIN D-2: 2 ng/mL

## 2017-08-25 LAB — VITAMIN B12: VITAMIN B 12: 569 pg/mL (ref 232–1245)

## 2017-08-25 LAB — C-REACTIVE PROTEIN: CRP: 0.5 mg/L (ref 0.0–4.9)

## 2017-08-28 ENCOUNTER — Ambulatory Visit (HOSPITAL_COMMUNITY): Admitting: Psychiatry

## 2017-09-01 NOTE — Progress Notes (Signed)
Patient's Name: Nancy Day  MRN: [id number]  Referring Provider: Idelle Crouch, MD  DOB: 1951/09/22  PCP: Idelle Crouch, MD  DOS: 09/02/2017  Note by: Gaspar Cola, MD  Service setting: Ambulatory outpatient  Specialty: Interventional Pain Management  Location: [facility] (AMB) Pain Management Facility    Patient type: Established   Primary Reason(s) for Visit: Evaluation of chronic illnesses with exacerbation, or progression (Level of risk: moderate) CC: Knee Pain (left) and Back Pain (lower)  HPI  Nancy Day is a 66 y.o. year old, female patient, who comes today for a follow-up evaluation. She has Anxiety state; Anxiety; Benign essential HTN; Breast cancer ([place]) (1999); CAD (coronary artery disease); Coronary atherosclerosis; DDD (degenerative disc disease), lumbar; DDD (degenerative disc disease), lumbosacral; Depression; Depressive disorder; Dizziness; Encounter for long-term (current) use of other medications; Essential hypertension; Fibromyalgia; Hyperlipidemia; Mixed hyperlipidemia; Vitamin D deficiency, unspecified; Long term current use of opiate analgesic; Chronic pain syndrome; Chronic neck pain (Tertiary Area of Pain) (Bilateral) (R>L); Chronic knee pain (Primary Area of Pain) (Left); Chronic low back pain (Fifth Area of Pain) (Bilateral) (L>R); Chronic sacroiliac joint pain (Bilateral) (L>R); Chronic shoulder pain (Fourth Area of Pain) (Bilateral) (R>L); Chronic lower extremity pain (Secondary Area of Pain) (Bilateral) (L>R); Obesity (BMI 30.0-34.9); Osteoarthritis of knee (Left); Chronic musculoskeletal pain; Tricompartment osteoarthritis of knee (Left); Disorder of skeletal system; Pharmacologic therapy; Problems influencing health status; Opiate use; Osteoarthritis; Neurogenic pain; History of rotator cuff surgery (R); Abnormal MRI, lumbar spine (10/01/09); Lumbar foraminal stenosis (Left: L5-S1); Lumbar facet arthropathy (Bilateral); Lumbar facet osteoarthritis (Bilateral);  Lumbar facet syndrome (Bilateral) (L>R); and Lumbar spinal stenosis (L3-4) on their problem list. Nancy Day was last seen on 08/19/2017. Her primarily concern today is the Knee Pain (left) and Back Pain (lower)  Pain Assessment: Location: Left Knee Radiating: denies Onset: More than a month ago Duration: Chronic pain Quality: Shooting Severity: 10-Worst pain ever/10 (self-reported pain score)  Note: Reported level is inconsistent with clinical observations. Clinically the patient looks like a 3/10 A 3/10 is viewed as "Moderate" and described as significantly interfering with activities of daily living (ADL). It becomes difficult to feed, bathe, get dressed, get on and off the toilet or to perform personal hygiene functions. Difficult to get in and out of bed or a chair without assistance. Very distracting. With effort, it can be ignored when deeply involved in activities. Information on the proper use of the pain scale provided to the patient today. When using our objective Pain Scale, levels between 6 and 10/10 are said to belong in an emergency room, as it progressively worsens from a 6/10, described as severely limiting, requiring emergency care not usually available at an outpatient pain management facility. At a 6/10 level, communication becomes difficult and requires great effort. Assistance to reach the emergency department may be required. Facial flushing and profuse sweating along with potentially dangerous increases in heart rate and blood pressure will be evident. Timing: Constant Modifying factors: nothing  Further details on both, my assessment(s), as well as the proposed treatment plan, please see below.   Controlled Substance Pharmacotherapy Assessment REMS (Risk Evaluation and Mitigation Strategy)  Analgesic: Tramadol 50 mg 3 times daily (tramadol 150 mg per day) Highest recorded MME/day:73m/day MME/day: 20 mg/day  Laboratory Chemistry  Inflammation Markers (CRP: Acute  Phase) (ESR: Chronic Phase) Lab Results  Component Value Date   CRP 0.5 08/19/2017                 Renal Function Markers No  results found for: BUN, CREATININE, GFRAA, GFRNONAA               Hepatic Function Markers No results found for: AST, ALT, ALBUMIN, ALKPHOS, HCVAB               Electrolytes Lab Results  Component Value Date   MG 2.0 08/19/2017                 Neuropathy Markers Lab Results  Component Value Date   VITAMINB12 569 08/19/2017                 Bone Pathology Markers Lab Results  Component Value Date   25OHVITD1 34 08/19/2017   25OHVITD2 2.0 08/19/2017   25OHVITD3 32 08/19/2017                 Rheumatology Markers No results found for: LABURIC              Coagulation Parameters No results found for: INR, LABPROT, APTT, PLT, DDIMER               Cardiovascular Markers No results found for: BNP, CKTOTAL, CKMB, TROPONINI, HGB, HCT               CA Markers No results found for: CEA, CA125, LABCA2               Note: Lab results reviewed.  Recent Diagnostic Imaging Review  Shoulder Imaging: Shoulder-R DG:  Results for orders placed during the hospital encounter of 08/19/17  DG Shoulder Right   Narrative CLINICAL DATA:  Chronic bilateral shoulder pain. Previous rotator cuff surgery on the right.  EXAM: RIGHT SHOULDER - 2+ VIEW  COMPARISON:  None in PACs  FINDINGS: The bones are subjectively adequately mineralized. The glenohumeral joint space is well maintained. There is mild narrowing of the subacromial subdeltoid space. Some narrowing of the [facility] joint is suspected but the [facility] joint is not well demonstrated. No acute bony abnormality is observed.  IMPRESSION: Narrowing of the subacromial subdeltoid space. No significant abnormality of the glenohumeral joint. Probable mild degenerative change of the [facility] joint.   Electronically Signed   By: David  Martinique M.D.   On: 08/20/2017 07:16    Shoulder-L DG:  Results for orders placed  during the hospital encounter of 08/19/17  DG Shoulder Left   Narrative CLINICAL DATA:  Chronic shoulder pain.  EXAM: LEFT SHOULDER - 2+ VIEW  COMPARISON:  CT 12/31/2016.  FINDINGS: Diffuse osteopenia and degenerative change. No acute bony or joint abnormality identified. No evidence of fracture or dislocation. Prominent air-fluid level left upper quadrant, possibly from gastric distention. Abdominal series suggested for further evaluation .  IMPRESSION: 1. Diffuse osteopenia degenerative change. No acute bony abnormality identified.  2. Prominent air-fluid collection left upper quadrant, possibly from gastric distention. Abdominal series suggested for further evaluation.   Electronically Signed   By: Marcello Moores  Register   On: 08/20/2017 07:18    Complexity Note: Imaging results reviewed. Results shared with Nancy Day, using Layman's terms.                         Meds   Current Outpatient Medications:  .  aspirin EC 81 MG tablet, Take 81 mg by mouth daily., Disp: , Rfl:  .  BIOTIN PO, Take 15,000 mcg by mouth daily at 2 PM., Disp: , Rfl:  .  Cholecalciferol (VITAMIN D3) 1000 units CAPS, Take by mouth daily at  6 (six) AM. , Disp: , Rfl:  .  Cinnamon 500 MG capsule, Take 500 mg by mouth daily. , Disp: , Rfl:  .  Coenzyme Q-10 200 MG CAPS, Take by mouth daily. , Disp: , Rfl:  .  cyclobenzaprine (FLEXERIL) 10 MG tablet, Take 10 mg by mouth as needed for muscle spasms. , Disp: , Rfl:  .  diltiazem (CARDIZEM CD) 360 MG 24 hr capsule, Take 360 mg by mouth daily. , Disp: , Rfl:  .  DULoxetine (CYMBALTA) 30 MG capsule, Take 30 mg by mouth 2 (two) times daily., Disp: , Rfl:  .  Garlic 0165 MG CAPS, Take by mouth daily at 2 PM., Disp: , Rfl:  .  lidocaine (XYLOCAINE) 2 % jelly, Apply topically as needed. , Disp: , Rfl:  .  losartan-hydrochlorothiazide (HYZAAR) 100-12.5 MG tablet, Take 1 tablet by mouth daily. , Disp: , Rfl:  .  niacin (NIASPAN) 1000 MG CR tablet, Take 1,000 mg by  mouth at bedtime., Disp: , Rfl:  .  potassium chloride (K-DUR) 10 MEQ tablet, Take 10 mEq by mouth 2 (two) times daily., Disp: , Rfl:  .  rosuvastatin (CRESTOR) 20 MG tablet, Take 20 mg by mouth daily., Disp: , Rfl:  .  Turmeric POWD, by Does not apply route every morning., Disp: , Rfl:  .  zolpidem (AMBIEN) 10 MG tablet, Take 5 mg by mouth at bedtime as needed for sleep., Disp: , Rfl:   ROS  Constitutional: Denies any fever or chills Gastrointestinal: No reported hemesis, hematochezia, vomiting, or acute GI distress Musculoskeletal: Denies any acute onset joint swelling, redness, loss of ROM, or weakness Neurological: No reported episodes of acute onset apraxia, aphasia, dysarthria, agnosia, amnesia, paralysis, loss of coordination, or loss of consciousness  Allergies  Nancy Day has no active allergies.  [place]  Drug: Nancy Day  reports that she does not use drugs. Alcohol:  reports that she drinks alcohol. Tobacco:  reports that she has quit smoking. she has never used smokeless tobacco. Medical:  has a past medical history of Allergy, Anxiety, Breast cancer ([place]) (1998), Coronary artery disease, DDD (degenerative disc disease), lumbar, Depression, Dyspnea, Fibromyalgia, GERD (gastroesophageal reflux disease), Hypertension, Spinal stenosis, and Vitamin D deficiency. Surgical: Nancy Day  has a past surgical history that includes Augmentation mammaplasty (Right, 2012); Mastectomy (Right, 1998); Breast biopsy (Right, 1998); Abdominal hysterectomy; Shoulder surgery; Toe Surgery; and COLONOSCOPY WITH PROPOFOL (N/A, 11/21/2016). Family: family history is not on file.  Constitutional Exam  General appearance: Well nourished, well developed, and well hydrated. In no apparent acute distress Vitals:   09/02/17 1049  BP: 130/74  Pulse: (!) 104  Resp: 16  Temp: 98 F (36.7 C)  TempSrc: Oral  SpO2: 100%  Weight: 223 lb (101.2 kg)  Height: 5' 7"  (1.702 m)   BMI Assessment: Estimated body mass  index is 34.93 kg/m as calculated from the following:   Height as of this encounter: 5' 7"  (1.702 m).   Weight as of this encounter: 223 lb (101.2 kg).  BMI interpretation table: BMI level Category Range association with higher incidence of chronic pain  <18 kg/m2 Underweight   18.5-24.9 kg/m2 Ideal body weight   25-29.9 kg/m2 Overweight Increased incidence by 20%  30-34.9 kg/m2 Obese (Class I) Increased incidence by 68%  35-39.9 kg/m2 Severe obesity (Class II) Increased incidence by 136%  >40 kg/m2 Extreme obesity (Class III) Increased incidence by 254%   BMI Readings from Last 4 Encounters:  09/02/17 34.93 kg/m  08/19/17  34.46 kg/m  06/16/17 34.41 kg/m  11/21/16 33.33 kg/m   Wt Readings from Last 4 Encounters:  09/02/17 223 lb (101.2 kg)  08/19/17 220 lb (99.8 kg)  06/16/17 223 lb (101.2 kg)  11/21/16 216 lb (98 kg)  Psych/Mental status: Alert, oriented x 3 (person, place, & time)       Eyes: PERLA Respiratory: No evidence of acute respiratory distress  Cervical Spine Area Exam  Skin & Axial Inspection: No masses, redness, edema, swelling, or associated skin lesions Alignment: Symmetrical Functional ROM: Unrestricted ROM      Stability: No instability detected Muscle Tone/Strength: Functionally intact. No obvious neuro-muscular anomalies detected. Sensory (Neurological): Unimpaired Palpation: No palpable anomalies              Upper Extremity (UE) Exam    Side: Right upper extremity  Side: Left upper extremity  Skin & Extremity Inspection: Skin color, temperature, and hair growth are WNL. No peripheral edema or cyanosis. No masses, redness, swelling, asymmetry, or associated skin lesions. No contractures.  Skin & Extremity Inspection: Skin color, temperature, and hair growth are WNL. No peripheral edema or cyanosis. No masses, redness, swelling, asymmetry, or associated skin lesions. No contractures.  Functional ROM: Unrestricted ROM          Functional ROM:  Unrestricted ROM          Muscle Tone/Strength: Functionally intact. No obvious neuro-muscular anomalies detected.  Muscle Tone/Strength: Functionally intact. No obvious neuro-muscular anomalies detected.  Sensory (Neurological): Unimpaired          Sensory (Neurological): Unimpaired          Palpation: No palpable anomalies              Palpation: No palpable anomalies              Specialized Test(s): Deferred         Specialized Test(s): Deferred          Thoracic Spine Area Exam  Skin & Axial Inspection: No masses, redness, or swelling Alignment: Symmetrical Functional ROM: Unrestricted ROM Stability: No instability detected Muscle Tone/Strength: Functionally intact. No obvious neuro-muscular anomalies detected. Sensory (Neurological): Unimpaired Muscle strength & Tone: No palpable anomalies  Lumbar Spine Area Exam  Skin & Axial Inspection: No masses, redness, or swelling Alignment: Symmetrical Functional ROM: Unrestricted ROM      Stability: No instability detected Muscle Tone/Strength: Functionally intact. No obvious neuro-muscular anomalies detected. Sensory (Neurological): Unimpaired Palpation: No palpable anomalies       Provocative Tests: Lumbar Hyperextension and rotation test: evaluation deferred today       Lumbar Lateral bending test: evaluation deferred today       Patrick's Maneuver: evaluation deferred today                    Gait & Posture Assessment  Ambulation: Unassisted Gait: Relatively normal for age and body habitus Posture: WNL   Lower Extremity Exam    Side: Right lower extremity  Side: Left lower extremity  Skin & Extremity Inspection: Skin color, temperature, and hair growth are WNL. No peripheral edema or cyanosis. No masses, redness, swelling, asymmetry, or associated skin lesions. No contractures.  Skin & Extremity Inspection: Skin color, temperature, and hair growth are WNL. No peripheral edema or cyanosis. No masses, redness, swelling, asymmetry,  or associated skin lesions. No contractures.  Functional ROM: Unrestricted ROM          Functional ROM: Unrestricted ROM  Muscle Tone/Strength: Functionally intact. No obvious neuro-muscular anomalies detected.  Muscle Tone/Strength: Functionally intact. No obvious neuro-muscular anomalies detected.  Sensory (Neurological): Unimpaired  Sensory (Neurological): Unimpaired  Palpation: No palpable anomalies  Palpation: No palpable anomalies   Assessment  Primary Diagnosis & Pertinent Problem List: The primary encounter diagnosis was Chronic knee pain (Primary Area of Pain) (Left). Diagnoses of Tricompartment osteoarthritis of knee (Left), Osteoarthritis of knee (Left), Chronic lower extremity pain (Secondary Area of Pain) (Bilateral) (L>R), Lumbar spinal stenosis (L3-4), Lumbar foraminal stenosis (Left: L5-S1), Chronic neck pain (Tertiary Area of Pain) (Bilateral) (R>L), Chronic shoulder pain (Fourth Area of Pain) (Bilateral) (R>L), and Chronic low back pain (Fifth Area of Pain) (Bilateral) (L>R) were also pertinent to this visit.  Status Diagnosis  Persistent Persistent Persistent 1. Chronic knee pain (Primary Area of Pain) (Left)   2. Tricompartment osteoarthritis of knee (Left)   3. Osteoarthritis of knee (Left)   4. Chronic lower extremity pain (Secondary Area of Pain) (Bilateral) (L>R)   5. Lumbar spinal stenosis (L3-4)   6. Lumbar foraminal stenosis (Left: L5-S1)   7. Chronic neck pain (Tertiary Area of Pain) (Bilateral) (R>L)   8. Chronic shoulder pain (Fourth Area of Pain) (Bilateral) (R>L)   9. Chronic low back pain (Fifth Area of Pain) (Bilateral) (L>R)     Problems updated and reviewed during this visit: No problems updated.   Time Note: Greater than 50% of the 40 minute(s) of face-to-face time spent with Nancy Day, was spent in counseling/coordination of care regarding: the appropriate use of the pain scale, Nancy Day's primary cause of pain, the results of her recent  test(s), the significance of each one oth the test(s) anomalies and it's corresponding characteristic pain pattern(s), the treatment plan, treatment alternatives, the risks and possible complications of proposed treatment, the need to bring and keep the BMI below 30, the importance of providing [place] with accurate post-procedure information and the need to collect and read the AVS material.  Plan of Care  Pharmacotherapy (Medications Ordered): No orders of the defined types were placed in this encounter. This SmartLink is deprecated. Use AVSMEDLIST instead to display the medication list for a patient. Medications administered today: Nancy Day had no medications administered during this visit.   Procedure Orders     KNEE INJECTION Lab Orders  No laboratory test(s) ordered today   Imaging Orders  No imaging studies ordered today   Referral Orders  No referral(s) requested today    Interventional management options: Planned, scheduled, and/or pending:   Diagnostic left-sided intra-articular knee joint injection with local anesthetic and steroids.   Considering:   Diagnostic bilateral lumbar facet block  Possible bilateral lumbar facet RFA  Diagnostic bilateral SI joint block  Possible bilateral sacroiliac joint RFA  Diagnostic left sided lumbar epidural steroid injection  Diagnostic bilateral transforaminal epidural steroid injection  Diagnostic left intra-articular knee joint injection  Diagnostic left genicular nerve block  Possible left genicular nerve RFA  Possible left series of 5 intra-articular Hyalgan knee injections  Diagnostic bilateral intra-articular shoulder joint injection  Diagnostic bilateral suprascapular nerve block  Possible bilateral suprascapular nerve RFA  Diagnostic right-sided cervical epidural steroid injection  Diagnostic bilateral cervical facet block  Possible bilateral cervical facet RFA  Diagnostic IV lidocaine infusion    Palliative PRN  treatment(s):   None at this time   Provider-requested follow-up: Return for Procedure (no sedation): (L) IA Knee inj (Steroid).  Future Appointments  Date Time Provider [place]  09/15/2017  8:15 AM Dossie Arbour,  Beatriz Chancellor, MD [facility] None  09/15/2017 12:20 PM [facility] [place]   Primary Care Physician: Idelle Crouch, MD Location: [facility] Note by: Gaspar Cola, MD Date: 09/02/2017; Time: 4:41 PM

## 2017-09-02 ENCOUNTER — Other Ambulatory Visit: Payer: Self-pay

## 2017-09-02 ENCOUNTER — Encounter: Payer: Self-pay | Admitting: Pain Medicine

## 2017-09-02 ENCOUNTER — Ambulatory Visit: Payer: Medicare Other | Attending: Pain Medicine | Admitting: Pain Medicine

## 2017-09-02 VITALS — BP 130/74 | HR 104 | Temp 98.0°F | Resp 16 | Ht 67.0 in | Wt 223.0 lb

## 2017-09-02 DIAGNOSIS — G894 Chronic pain syndrome: Secondary | ICD-10-CM | POA: Insufficient documentation

## 2017-09-02 DIAGNOSIS — F329 Major depressive disorder, single episode, unspecified: Secondary | ICD-10-CM | POA: Diagnosis not present

## 2017-09-02 DIAGNOSIS — M25562 Pain in left knee: Secondary | ICD-10-CM

## 2017-09-02 DIAGNOSIS — M25512 Pain in left shoulder: Secondary | ICD-10-CM

## 2017-09-02 DIAGNOSIS — M5136 Other intervertebral disc degeneration, lumbar region: Secondary | ICD-10-CM | POA: Diagnosis not present

## 2017-09-02 DIAGNOSIS — I10 Essential (primary) hypertension: Secondary | ICD-10-CM | POA: Diagnosis not present

## 2017-09-02 DIAGNOSIS — M5441 Lumbago with sciatica, right side: Secondary | ICD-10-CM

## 2017-09-02 DIAGNOSIS — M5442 Lumbago with sciatica, left side: Secondary | ICD-10-CM | POA: Diagnosis not present

## 2017-09-02 DIAGNOSIS — M4807 Spinal stenosis, lumbosacral region: Secondary | ICD-10-CM | POA: Insufficient documentation

## 2017-09-02 DIAGNOSIS — I2510 Atherosclerotic heart disease of native coronary artery without angina pectoris: Secondary | ICD-10-CM | POA: Insufficient documentation

## 2017-09-02 DIAGNOSIS — E559 Vitamin D deficiency, unspecified: Secondary | ICD-10-CM | POA: Diagnosis not present

## 2017-09-02 DIAGNOSIS — M25511 Pain in right shoulder: Secondary | ICD-10-CM | POA: Diagnosis not present

## 2017-09-02 DIAGNOSIS — M1712 Unilateral primary osteoarthritis, left knee: Secondary | ICD-10-CM

## 2017-09-02 DIAGNOSIS — M79605 Pain in left leg: Secondary | ICD-10-CM

## 2017-09-02 DIAGNOSIS — Z853 Personal history of malignant neoplasm of breast: Secondary | ICD-10-CM | POA: Insufficient documentation

## 2017-09-02 DIAGNOSIS — Z79891 Long term (current) use of opiate analgesic: Secondary | ICD-10-CM | POA: Diagnosis not present

## 2017-09-02 DIAGNOSIS — Z87891 Personal history of nicotine dependence: Secondary | ICD-10-CM | POA: Diagnosis not present

## 2017-09-02 DIAGNOSIS — M48061 Spinal stenosis, lumbar region without neurogenic claudication: Secondary | ICD-10-CM | POA: Diagnosis not present

## 2017-09-02 DIAGNOSIS — M79604 Pain in right leg: Secondary | ICD-10-CM

## 2017-09-02 DIAGNOSIS — Z79899 Other long term (current) drug therapy: Secondary | ICD-10-CM | POA: Insufficient documentation

## 2017-09-02 DIAGNOSIS — Z7982 Long term (current) use of aspirin: Secondary | ICD-10-CM | POA: Diagnosis not present

## 2017-09-02 DIAGNOSIS — K219 Gastro-esophageal reflux disease without esophagitis: Secondary | ICD-10-CM | POA: Insufficient documentation

## 2017-09-02 DIAGNOSIS — M542 Cervicalgia: Secondary | ICD-10-CM

## 2017-09-02 DIAGNOSIS — G8929 Other chronic pain: Secondary | ICD-10-CM | POA: Diagnosis not present

## 2017-09-02 DIAGNOSIS — M9983 Other biomechanical lesions of lumbar region: Secondary | ICD-10-CM

## 2017-09-02 NOTE — Progress Notes (Signed)
Safety precautions to be maintained throughout the outpatient stay will include: orient to surroundings, keep bed in low position, maintain call bell within reach at all times, provide assistance with transfer out of bed and ambulation.  

## 2017-09-02 NOTE — Patient Instructions (Addendum)
____________________________________________________________________________________________  Pain Scale  Introduction: The pain score used by this practice is the Verbal Numerical Rating Scale (VNRS-11). This is an 11-point scale. It is for adults and children 10 years or older. There are significant differences in how the pain score is reported, used, and applied. Forget everything you learned in the past and learn this scoring system.  General Information: The scale should reflect your current level of pain. Unless you are specifically asked for the level of your worst pain, or your average pain. If you are asked for one of these two, then it should be understood that it is over the past 24 hours.  Basic Activities of Daily Living (ADL): Personal hygiene, dressing, eating, transferring, and using restroom.  Instructions: Most patients tend to report their level of pain as a combination of two factors, their physical pain and their psychosocial pain. This last one is also known as "suffering" and it is reflection of how physical pain affects you socially and psychologically. From now on, report them separately. From this point on, when asked to report your pain level, report only your physical pain. Use the following table for reference.  Pain Clinic Pain Levels (0-5/10)  Pain Level Score  Description  No Pain 0   Mild pain 1 Nagging, annoying, but does not interfere with basic activities of daily living (ADL). Patients are able to eat, bathe, get dressed, toileting (being able to get on and off the toilet and perform personal hygiene functions), transfer (move in and out of bed or a chair without assistance), and maintain continence (able to control bladder and bowel functions). Blood pressure and heart rate are unaffected. A normal heart rate for a healthy adult ranges from 60 to 100 bpm (beats per minute).   Mild to moderate pain 2 Noticeable and distracting. Impossible to hide from other  people. More frequent flare-ups. Still possible to adapt and function close to normal. It can be very annoying and may have occasional stronger flare-ups. With discipline, patients may get used to it and adapt.   Moderate pain 3 Interferes significantly with activities of daily living (ADL). It becomes difficult to feed, bathe, get dressed, get on and off the toilet or to perform personal hygiene functions. Difficult to get in and out of bed or a chair without assistance. Very distracting. With effort, it can be ignored when deeply involved in activities.   Moderately severe pain 4 Impossible to ignore for more than a few minutes. With effort, patients may still be able to manage work or participate in some social activities. Very difficult to concentrate. Signs of autonomic nervous system discharge are evident: dilated pupils (mydriasis); mild sweating (diaphoresis); sleep interference. Heart rate becomes elevated (>115 bpm). Diastolic blood pressure (lower number) rises above 100 mmHg. Patients find relief in laying down and not moving.   Severe pain 5 Intense and extremely unpleasant. Associated with frowning face and frequent crying. Pain overwhelms the senses.  Ability to do any activity or maintain social relationships becomes significantly limited. Conversation becomes difficult. Pacing back and forth is common, as getting into a comfortable position is nearly impossible. Pain wakes you up from deep sleep. Physical signs will be obvious: pupillary dilation; increased sweating; goosebumps; brisk reflexes; cold, clammy hands and feet; nausea, vomiting or dry heaves; loss of appetite; significant sleep disturbance with inability to fall asleep or to remain asleep. When persistent, significant weight loss is observed due to the complete loss of appetite and sleep deprivation.  Blood   pressure and heart rate becomes significantly elevated. Caution: If elevated blood pressure triggers a pounding headache  associated with blurred vision, then the patient should immediately seek attention at an urgent or emergency care unit, as these may be signs of an impending stroke.    Emergency Department Pain Levels (6-10/10)  Emergency Room Pain 6 Severely limiting. Requires emergency care and should not be seen or managed at an outpatient pain management facility. Communication becomes difficult and requires great effort. Assistance to reach the emergency department may be required. Facial flushing and profuse sweating along with potentially dangerous increases in heart rate and blood pressure will be evident.   Distressing pain 7 Self-care is very difficult. Assistance is required to transport, or use restroom. Assistance to reach the emergency department will be required. Tasks requiring coordination, such as bathing and getting dressed become very difficult.   Disabling pain 8 Self-care is no longer possible. At this level, pain is disabling. The individual is unable to do even the most "basic" activities such as walking, eating, bathing, dressing, transferring to a bed, or toileting. Fine motor skills are lost. It is difficult to think clearly.   Incapacitating pain 9 Pain becomes incapacitating. Thought processing is no longer possible. Difficult to remember your own name. Control of movement and coordination are lost.   The worst pain imaginable 10 At this level, most patients pass out from pain. When this level is reached, collapse of the autonomic nervous system occurs, leading to a sudden drop in blood pressure and heart rate. This in turn results in a temporary and dramatic drop in blood flow to the brain, leading to a loss of consciousness. Fainting is one of the body's self defense mechanisms. Passing out puts the brain in a calmed state and causes it to shut down for a while, in order to begin the healing process.    Summary: 1. Refer to this scale when providing [place] with your pain level. 2. Be  accurate and careful when reporting your pain level. This will help with your care. 3. Over-reporting your pain level will lead to loss of credibility. 4. Even a level of 1/10 means that there is pain and will be treated at our facility. 5. High, inaccurate reporting will be documented as "Symptom Exaggeration", leading to loss of credibility and suspicions of possible secondary gains such as obtaining more narcotics, or wanting to appear disabled, for fraudulent reasons. 6. Only pain levels of 5 or below will be seen at our facility. 7. Pain levels of 6 and above will be sent to the Emergency Department and the appointment cancelled. ____________________________________________________________________________________________   ____________________________________________________________________________________________  Preparing for your procedure (without sedation) Instructions: . Oral Intake: Do not eat or drink anything for at least 3 hours prior to your procedure. . Transportation: Unless otherwise stated by your physician, you may drive yourself after the procedure. . Blood Pressure Medicine: Take your blood pressure medicine with a sip of water the morning of the procedure. . Blood thinners:  . Diabetics on insulin: Notify the staff so that you can be scheduled 1st case in the morning. If your diabetes requires high dose insulin, take only  of your normal insulin dose the morning of the procedure and notify the staff that you have done so. . Preventing infections: Shower with an antibacterial soap the morning of your procedure.  . Build-up your immune system: Take 1000 mg of Vitamin C with every meal (3 times a day) the day prior to your  procedure. [place] Kitchen Antibiotics: Inform the staff if you have a condition or reason that requires you to take antibiotics before dental procedures. . Pregnancy: If you are pregnant, call and cancel the procedure. . Sickness: If you have a cold, fever, or any  active infections, call and cancel the procedure. . Arrival: You must be in the facility at least 30 minutes prior to your scheduled procedure. . Children: Do not bring any children with you. . Dress appropriately: Bring dark clothing that you would not mind if they get stained. . Valuables: Do not bring any jewelry or valuables. Procedure appointments are reserved for interventional treatments only. [place]land Kitchen No Prescription Refills. . No medication changes will be discussed during procedure appointments. . No disability issues will be discussed. ____________________________________________________________________________________________

## 2017-09-04 ENCOUNTER — Ambulatory Visit (HOSPITAL_COMMUNITY): Admitting: Psychiatry

## 2017-09-04 ENCOUNTER — Other Ambulatory Visit: Payer: Self-pay | Admitting: Internal Medicine

## 2017-09-04 DIAGNOSIS — R1012 Left upper quadrant pain: Secondary | ICD-10-CM

## 2017-09-15 ENCOUNTER — Ambulatory Visit: Payer: Medicare Other | Attending: Pain Medicine | Admitting: Pain Medicine

## 2017-09-15 ENCOUNTER — Other Ambulatory Visit: Payer: Self-pay

## 2017-09-15 ENCOUNTER — Ambulatory Visit
Admission: RE | Admit: 2017-09-15 | Discharge: 2017-09-15 | Disposition: A | Discharge status: Home / Self Care | Payer: Medicare Other | Source: Ambulatory Visit | Attending: Internal Medicine | Admitting: Internal Medicine

## 2017-09-15 ENCOUNTER — Encounter: Payer: Self-pay | Admitting: Pain Medicine

## 2017-09-15 VITALS — BP 131/75 | HR 76 | Temp 98.0°F | Resp 16 | Ht 67.0 in | Wt 222.0 lb

## 2017-09-15 DIAGNOSIS — G8929 Other chronic pain: Secondary | ICD-10-CM | POA: Diagnosis present

## 2017-09-15 DIAGNOSIS — Z1231 Encounter for screening mammogram for malignant neoplasm of breast: Secondary | ICD-10-CM | POA: Diagnosis present

## 2017-09-15 DIAGNOSIS — M1712 Unilateral primary osteoarthritis, left knee: Secondary | ICD-10-CM | POA: Diagnosis not present

## 2017-09-15 DIAGNOSIS — M25562 Pain in left knee: Secondary | ICD-10-CM | POA: Insufficient documentation

## 2017-09-15 MED ORDER — LIDOCAINE HCL (PF) 1 % IJ SOLN
INTRAMUSCULAR | Status: AC
  Filled 2017-09-15: qty 5

## 2017-09-15 MED ORDER — ROPIVACAINE HCL 2 MG/ML IJ SOLN
INTRAMUSCULAR | Status: AC
  Filled 2017-09-15: qty 10

## 2017-09-15 MED ORDER — ROPIVACAINE HCL 2 MG/ML IJ SOLN
2.0000 mL | Freq: Once | INTRAMUSCULAR | Status: AC
  Administered 2017-09-15: 10 mL via INTRA_ARTICULAR

## 2017-09-15 MED ORDER — METHYLPREDNISOLONE ACETATE 80 MG/ML IJ SUSP
INTRAMUSCULAR | Status: AC
  Filled 2017-09-15: qty 1

## 2017-09-15 MED ORDER — METHYLPREDNISOLONE ACETATE 80 MG/ML IJ SUSP
80.0000 mg | Freq: Once | INTRAMUSCULAR | Status: DC
  Administered 2017-09-15: 80 mg via INTRA_ARTICULAR

## 2017-09-15 MED ORDER — LIDOCAINE HCL (PF) 1 % IJ SOLN
5.0000 mL | Freq: Once | INTRAMUSCULAR | Status: AC
  Administered 2017-09-15: 5 mL

## 2017-09-15 NOTE — Progress Notes (Signed)
Safety precautions to be maintained throughout the outpatient stay will include: orient to surroundings, keep bed in low position, maintain call bell within reach at all times, provide assistance with transfer out of bed and ambulation.  

## 2017-09-15 NOTE — Patient Instructions (Signed)

## 2017-09-15 NOTE — Progress Notes (Signed)
Patient's Name: Nancy Day  MRN: [id number]  Referring Provider: Idelle Crouch, MD  DOB: 01/04/1951  PCP: Idelle Crouch, MD  DOS: 09/15/2017  Note by: Gaspar Cola, MD  Service setting: Ambulatory outpatient  Specialty: Interventional Pain Management  Patient type: Established  Location: [facility] (AMB) Pain Management Facility  Visit type: Interventional Procedure   Primary Reason for Visit: Interventional Pain Management Treatment. CC: Knee Pain (left)  Procedure:  Anesthesia, Analgesia, Anxiolysis:  Type: Diagnostic Intra-Articular Local anesthetic and steroid Knee Injection Region: Lateral infrapatellar Knee Region Level: Knee Joint Laterality: Left knee  Type: Local Anesthesia Local Anesthetic: Lidocaine 1% Route: Infiltration ([place]/IM) IV Access: Declined Sedation: Declined  Indication(s): Analgesia           Indications: 1. Chronic knee pain (Primary Area of Pain) (Left)   2. Osteoarthritis of knee (Left)   3. Tricompartment osteoarthritis of knee (Left)    Pain Score: Pre-procedure: 6 /10 Post-procedure: 0-No pain/10  Pre-op Assessment:  Nancy Day is a 66 y.o. (year old), female patient, seen today for interventional treatment. She  has a past surgical history that includes Augmentation mammaplasty (Right, 2012); Mastectomy (Right, 1998); Breast biopsy (Right, 1998); Abdominal hysterectomy; Shoulder surgery; Toe Surgery; and Colonoscopy with propofol (N/A, 11/21/2016). Nancy Day has a current medication list which includes the following prescription(s): aspirin ec, biotin, bupropion, vitamin d3, cinnamon, coenzyme q-10, cyclobenzaprine, diltiazem, duloxetine, garlic, lidocaine, losartan-hydrochlorothiazide, niacin, potassium chloride, rosuvastatin, turmeric, and zolpidem. Her primarily concern today is the Knee Pain (left)  Initial Vital Signs: There were no vitals taken for this visit. BMI: Estimated body mass index is 34.77 kg/m as calculated from the following:    Height as of this encounter: 5\' 7"  (1.702 m).   Weight as of this encounter: 222 lb (100.7 kg).  Risk Assessment: Allergies: Reviewed. She has no active allergies.  Allergy Precautions: None required Coagulopathies: Reviewed. None identified.  Blood-thinner therapy: None at this time Active Infection(s): Reviewed. None identified. Ms. Shed is afebrile  Site Confirmation: Nancy Day was asked to confirm the procedure and laterality before marking the site Procedure checklist: Completed Consent: Before the procedure and under the influence of no sedative(s), amnesic(s), or anxiolytics, the patient was informed of the treatment options, risks and possible complications. To fulfill our ethical and legal obligations, as recommended by the American Medical Association's Code of Ethics, I have informed the patient of my clinical impression; the nature and purpose of the treatment or procedure; the risks, benefits, and possible complications of the intervention; the alternatives, including doing nothing; the risk(s) and benefit(s) of the alternative treatment(s) or procedure(s); and the risk(s) and benefit(s) of doing nothing. The patient was provided information about the general risks and possible complications associated with the procedure. These may include, but are not limited to: failure to achieve desired goals, infection, bleeding, organ or nerve damage, allergic reactions, paralysis, and death. In addition, the patient was informed of those risks and complications associated to the procedure, such as failure to decrease pain; infection; bleeding; organ or nerve damage with subsequent damage to sensory, motor, and/or autonomic systems, resulting in permanent pain, numbness, and/or weakness of one or several areas of the body; allergic reactions; (i.e.: anaphylactic reaction); and/or death. Furthermore, the patient was informed of those risks and complications associated with the medications. These  include, but are not limited to: allergic reactions (i.e.: anaphylactic or anaphylactoid reaction(s)); adrenal axis suppression; blood sugar elevation that in diabetics may result in ketoacidosis or comma; water retention that  in patients with history of congestive heart failure may result in shortness of breath, pulmonary edema, and decompensation with resultant heart failure; weight gain; swelling or edema; medication-induced neural toxicity; particulate matter embolism and blood vessel occlusion with resultant organ, and/or nervous system infarction; and/or aseptic necrosis of one or more joints. Finally, the patient was informed that Medicine is not an exact science; therefore, there is also the possibility of unforeseen or unpredictable risks and/or possible complications that may result in a catastrophic outcome. The patient indicated having understood very clearly. We have given the patient no guarantees and we have made no promises. Enough time was given to the patient to ask questions, all of which were answered to the patient's satisfaction. Nancy Day has indicated that she wanted to continue with the procedure. Attestation: I, the ordering provider, attest that I have discussed with the patient the benefits, risks, side-effects, alternatives, likelihood of achieving goals, and potential problems during recovery for the procedure that I have provided informed consent. Date: 09/15/2017; Time: 6:53 AM  Pre-Procedure Preparation:  Monitoring: As per clinic protocol. Respiration, ETCO2, SpO2, BP, heart rate and rhythm monitor placed and checked for adequate function Safety Precautions: Patient was assessed for positional comfort and pressure points before starting the procedure. Time-out: I initiated and conducted the "Time-out" before starting the procedure, as per protocol. The patient was asked to participate by confirming the accuracy of the "Time Out" information. Verification of the correct  person, site, and procedure were performed and confirmed by me, the nursing staff, and the patient. "Time-out" conducted as per Joint Commission's Universal Protocol (UP.01.01.01). "Time-out" Date & Time: 09/15/2017; 0841 hrs.  Description of Procedure Process:   Position: Sitting Target Area: Knee Joint Approach: Just above the Lateral tibial plateau, lateral to the infrapatellar tendon. Area Prepped: Entire knee area, from the mid-thigh to the mid-shin. Prepping solution: ChloraPrep (2% chlorhexidine gluconate and 70% isopropyl alcohol) Safety Precautions: Aspiration looking for blood return was conducted prior to all injections. At no point did we inject any substances, as a needle was being advanced. No attempts were made at seeking any paresthesias. Safe injection practices and needle disposal techniques used. Medications properly checked for expiration dates. SDV (single dose vial) medications used. Description of the Procedure: Protocol guidelines were followed. The patient was placed in position over the fluoroscopy table. The target area was identified and the area prepped in the usual manner. Skin desensitized using vapocoolant spray. Skin & deeper tissues infiltrated with local anesthetic. Appropriate amount of time allowed to pass for local anesthetics to take effect. The procedure needles were then advanced to the target area. Proper needle placement secured. Negative aspiration confirmed. Solution injected in intermittent fashion, asking for systemic symptoms every 0.5cc of injectate. The needles were then removed and the area cleansed, making sure to leave some of the prepping solution back to take advantage of its long term bactericidal properties. Vitals:   09/15/17 0805 09/15/17 0844  BP: 124/74 131/75  Pulse: 78 76  Resp: 16 16  Temp: 98 F (36.7 C)   TempSrc: Oral   SpO2: 99% 99%  Weight: 222 lb (100.7 kg)   Height: 5\' 7"  (1.702 m)     Start Time: 0841 hrs. End Time: 0843  hrs. Materials:  Needle(s) Type: Regular needle Gauge: 22G Length: 3.5-in Medication(s): We administered lidocaine (PF), methylPREDNISolone acetate, and ropivacaine (PF) 2 mg/mL (0.2%). Please see chart orders for dosing details.  Imaging Guidance:  Type of Imaging Technique: None used Indication(s): N/A  Exposure Time: No patient exposure Contrast: None used. Fluoroscopic Guidance: N/A Ultrasound Guidance: N/A Interpretation: N/A  Antibiotic Prophylaxis:  Indication(s): None identified Antibiotic given: None  Post-operative Assessment:  EBL: None Complications: No immediate post-treatment complications observed by team, or reported by patient. Note: The patient tolerated the entire procedure well. A repeat set of vitals were taken after the procedure and the patient was kept under observation following institutional policy, for this type of procedure. Post-procedural neurological assessment was performed, showing return to baseline, prior to discharge. The patient was provided with post-procedure discharge instructions, including a section on how to identify potential problems. Should any problems arise concerning this procedure, the patient was given instructions to immediately contact us, at any time, without hesitation. In any case, we plan to contact the patient by telephone for a follow-up status report regarding this interventional procedure. Comments:  No additional relevant information.  Plan of Care   Imaging Orders  No imaging studies ordered today    Procedure Orders     KNEE INJECTION  Medications ordered for procedure: Meds ordered this encounter  Medications  . lidocaine (PF) (XYLOCAINE) 1 % injection 5 mL  . DISCONTD: methylPREDNISolone acetate (DEPO-MEDROL) injection 80 mg  . ropivacaine (PF) 2 mg/mL (0.2%) (NAROPIN) injection 2 mL   Medications administered: We administered lidocaine (PF), methylPREDNISolone acetate, and ropivacaine (PF) 2 mg/mL (0.2%).   See the medical record for exact dosing, route, and time of administration.  This SmartLink is deprecated. Use AVSMEDLIST instead to display the medication list for a patient. Disposition: Discharge home  Discharge Date & Time: 09/15/2017; 0850 hrs.   Physician-requested Follow-up: Return for post-procedure eval by Dr. Dossie Arbour in 2 wks. Future Appointments  Date Time Provider [place]  09/15/2017 12:20 PM [facility]  09/30/2017 10:15 AM Milinda Pointer, MD [facility] None   Primary Care Physician: Idelle Crouch, MD Location: [facility] Note by: Gaspar Cola, MD Date: 09/15/2017; Time: 10:21 AM  Disclaimer:  Medicine is not an exact science. The only guarantee in medicine is that nothing is guaranteed. It is important to note that the decision to proceed with this intervention was based on the information collected from the patient. The Data and conclusions were drawn from the patient's questionnaire, the interview, and the physical examination. Because the information was provided in large part by the patient, it cannot be guaranteed that it has not been purposely or unconsciously manipulated. Every effort has been made to obtain as much relevant data as possible for this evaluation. It is important to note that the conclusions that lead to this procedure are derived in large part from the available data. Always take into account that the treatment will also be dependent on availability of resources and existing treatment guidelines, considered by other Pain Management Practitioners as being common knowledge and practice, at the time of the intervention. For Medico-Legal purposes, it is also important to point out that variation in procedural techniques and pharmacological choices are the acceptable norm. The indications, contraindications, technique, and results of the above procedure should only be interpreted and judged by a  Board-Certified Interventional Pain Specialist with extensive familiarity and expertise in the same exact procedure and technique.

## 2017-09-16 ENCOUNTER — Telehealth: Payer: Self-pay | Admitting: *Deleted

## 2017-09-16 NOTE — Telephone Encounter (Signed)
Denies complications post procedure. 

## 2017-09-22 ENCOUNTER — Ambulatory Visit
Admission: RE | Admit: 2017-09-22 | Discharge: 2017-09-22 | Disposition: A | Discharge status: Home / Self Care | Payer: Medicare Other | Source: Ambulatory Visit | Attending: Internal Medicine | Admitting: Internal Medicine

## 2017-09-22 DIAGNOSIS — R1012 Left upper quadrant pain: Secondary | ICD-10-CM | POA: Diagnosis present

## 2017-09-30 ENCOUNTER — Ambulatory Visit: Payer: Medicare Other | Attending: Pain Medicine | Admitting: Pain Medicine

## 2017-09-30 ENCOUNTER — Encounter: Payer: Self-pay | Admitting: Pain Medicine

## 2017-09-30 ENCOUNTER — Other Ambulatory Visit: Payer: Self-pay

## 2017-09-30 VITALS — BP 161/77 | HR 97 | Temp 98.5°F | Resp 16 | Ht 67.5 in | Wt 222.0 lb

## 2017-09-30 DIAGNOSIS — Z87891 Personal history of nicotine dependence: Secondary | ICD-10-CM | POA: Insufficient documentation

## 2017-09-30 DIAGNOSIS — M1712 Unilateral primary osteoarthritis, left knee: Secondary | ICD-10-CM | POA: Diagnosis not present

## 2017-09-30 DIAGNOSIS — M542 Cervicalgia: Secondary | ICD-10-CM | POA: Diagnosis not present

## 2017-09-30 DIAGNOSIS — M5441 Lumbago with sciatica, right side: Secondary | ICD-10-CM | POA: Insufficient documentation

## 2017-09-30 DIAGNOSIS — E559 Vitamin D deficiency, unspecified: Secondary | ICD-10-CM | POA: Insufficient documentation

## 2017-09-30 DIAGNOSIS — Z79891 Long term (current) use of opiate analgesic: Secondary | ICD-10-CM | POA: Diagnosis not present

## 2017-09-30 DIAGNOSIS — Z90710 Acquired absence of both cervix and uterus: Secondary | ICD-10-CM | POA: Diagnosis not present

## 2017-09-30 DIAGNOSIS — M25511 Pain in right shoulder: Secondary | ICD-10-CM | POA: Insufficient documentation

## 2017-09-30 DIAGNOSIS — M25512 Pain in left shoulder: Secondary | ICD-10-CM | POA: Diagnosis not present

## 2017-09-30 DIAGNOSIS — M79605 Pain in left leg: Secondary | ICD-10-CM | POA: Insufficient documentation

## 2017-09-30 DIAGNOSIS — M79604 Pain in right leg: Secondary | ICD-10-CM | POA: Insufficient documentation

## 2017-09-30 DIAGNOSIS — Z9011 Acquired absence of right breast and nipple: Secondary | ICD-10-CM | POA: Diagnosis not present

## 2017-09-30 DIAGNOSIS — I2510 Atherosclerotic heart disease of native coronary artery without angina pectoris: Secondary | ICD-10-CM | POA: Insufficient documentation

## 2017-09-30 DIAGNOSIS — K219 Gastro-esophageal reflux disease without esophagitis: Secondary | ICD-10-CM | POA: Diagnosis not present

## 2017-09-30 DIAGNOSIS — G894 Chronic pain syndrome: Secondary | ICD-10-CM | POA: Diagnosis not present

## 2017-09-30 DIAGNOSIS — Z7982 Long term (current) use of aspirin: Secondary | ICD-10-CM | POA: Diagnosis not present

## 2017-09-30 DIAGNOSIS — M5442 Lumbago with sciatica, left side: Secondary | ICD-10-CM | POA: Insufficient documentation

## 2017-09-30 DIAGNOSIS — M797 Fibromyalgia: Secondary | ICD-10-CM | POA: Insufficient documentation

## 2017-09-30 DIAGNOSIS — Z79899 Other long term (current) drug therapy: Secondary | ICD-10-CM | POA: Diagnosis not present

## 2017-09-30 DIAGNOSIS — M25562 Pain in left knee: Secondary | ICD-10-CM

## 2017-09-30 DIAGNOSIS — M5136 Other intervertebral disc degeneration, lumbar region: Secondary | ICD-10-CM | POA: Insufficient documentation

## 2017-09-30 DIAGNOSIS — I10 Essential (primary) hypertension: Secondary | ICD-10-CM | POA: Insufficient documentation

## 2017-09-30 DIAGNOSIS — Z9889 Other specified postprocedural states: Secondary | ICD-10-CM | POA: Insufficient documentation

## 2017-09-30 DIAGNOSIS — G8929 Other chronic pain: Secondary | ICD-10-CM

## 2017-09-30 NOTE — Patient Instructions (Addendum)
____________________________________________________________________________________________  Preparing for your procedure (without sedation) Instructions: . Oral Intake: Do not eat or drink anything for at least 3 hours prior to your procedure. . Transportation: Unless otherwise stated by your physician, you may drive yourself after the procedure. . Blood Pressure Medicine: Take your blood pressure medicine with a sip of water the morning of the procedure. . Blood thinners:  . Diabetics on insulin: Notify the staff so that you can be scheduled 1st case in the morning. If your diabetes requires high dose insulin, take only  of your normal insulin dose the morning of the procedure and notify the staff that you have done so. . Preventing infections: Shower with an antibacterial soap the morning of your procedure.  . Build-up your immune system: Take 1000 mg of Vitamin C with every meal (3 times a day) the day prior to your procedure. [facility] Antibiotics: Inform the staff if you have a condition or reason that requires you to take antibiotics before dental procedures. . Pregnancy: If you are pregnant, call and cancel the procedure. . Sickness: If you have a cold, fever, or any active infections, call and cancel the procedure. . Arrival: You must be in the facility at least 30 minutes prior to your scheduled procedure. . Children: Do not bring any children with you. . Dress appropriately: Bring dark clothing that you would not mind if they get stained. . Valuables: Do not bring any jewelry or valuables. Procedure appointments are reserved for interventional treatments only. [facility] No Prescription Refills. . No medication changes will be discussed during procedure appointments. . No disability issues will be discussed. ____________________________________________________________________________________________    Knee Injection A knee injection is a procedure to get medicine into your knee joint. Your  health care provider puts a needle into the joint and injects medicine with an attached syringe. The injected medicine may relieve the pain, swelling, and stiffness of arthritis. The injected medicine may also help to lubricate and cushion your knee joint. You may need more than one injection. Tell a health care provider about: Any allergies you have. All medicines you are taking, including vitamins, herbs, eye drops, creams, and over-the-counter medicines. Any problems you or family members have had with anesthetic medicines. Any blood disorders you have. Any surgeries you have had. Any medical conditions you have. What are the risks? Generally, this is a safe procedure. However, problems may occur, including: Infection. Bleeding. Worsening symptoms. Damage to the area around your knee. Allergic reaction to any of the medicines. Skin reactions from repeated injections.  What happens before the procedure? Ask your health care provider about changing or stopping your regular medicines. This is especially important if you are taking diabetes medicines or blood thinners. Plan to have someone take you home after the procedure. What happens during the procedure? You will sit or lie down in a position for your knee to be treated. The skin over your kneecap will be cleaned with a germ-killing solution (antiseptic). You will be given a medicine that numbs the area (local anesthetic). You may feel some stinging. After your knee becomes numb, you will have a second injection. This is the medicine. This needle is carefully placed between your kneecap and your knee. The medicine is injected into the joint space. At the end of the procedure, the needle will be removed. A bandage (dressing) may be placed over the injection site. The procedure may vary among health care providers and hospitals. What happens after the procedure? You may have to  move your knee through its full range of motion. This helps  to get all of the medicine into your joint space. Your blood pressure, heart rate, breathing rate, and blood oxygen level will be monitored often until the medicines you were given have worn off. You will be watched to make sure that you do not have a reaction to the injected medicine. This information is not intended to replace advice given to you by your health care provider. Make sure you discuss any questions you have with your health care provider. Document Released: 12/28/2006 Document Revised: 03/07/2016 Document Reviewed: 08/16/2014 [employer] Interactive Patient Education  2018 [employer].

## 2017-09-30 NOTE — Progress Notes (Signed)
Patient's Name: Nancy Day  MRN: [id number]  Referring Provider: Idelle Crouch, MD  DOB: 11/19/1950  PCP: Idelle Crouch, MD  DOS: 09/30/2017  Note by: Gaspar Cola, MD  Service setting: Ambulatory outpatient  Specialty: Interventional Pain Management  Location: [facility] (AMB) Pain Management Facility    Patient type: Established   Primary Reason(s) for Visit: Encounter for post-procedure evaluation of chronic illness with mild to moderate exacerbation CC: Knee Pain (left) and Back Pain (lower)  HPI  Nancy Day is a 66 y.o. year old, female patient, who comes today for a post-procedure evaluation. She has Anxiety state; Anxiety; Benign essential HTN; Breast cancer ([place]) (1999); CAD (coronary artery disease); Coronary atherosclerosis; DDD (degenerative disc disease), lumbar; DDD (degenerative disc disease), lumbosacral; Depression; Depressive disorder; Dizziness; Encounter for long-term (current) use of other medications; Essential hypertension; Fibromyalgia; Hyperlipidemia; Mixed hyperlipidemia; Vitamin D deficiency, unspecified; Long term current use of opiate analgesic; Chronic pain syndrome; Chronic neck pain (Tertiary Area of Pain) (Bilateral) (R>L); Chronic knee pain (Primary Area of Pain) (Left); Chronic low back pain (Fifth Area of Pain) (Bilateral) (L>R); Chronic sacroiliac joint pain (Bilateral) (L>R); Chronic shoulder pain (Fourth Area of Pain) (Bilateral) (R>L); Chronic lower extremity pain (Secondary Area of Pain) (Bilateral) (L>R); Obesity (BMI 30.0-34.9); Osteoarthritis of knee (Left); Chronic musculoskeletal pain; Tricompartment osteoarthritis of knee (Left); Disorder of skeletal system; Pharmacologic therapy; Problems influencing health status; Opiate use; Osteoarthritis; Neurogenic pain; History of rotator cuff surgery (R); Abnormal MRI, lumbar spine (10/01/09); Lumbar foraminal stenosis (Left: L5-S1); Lumbar facet arthropathy (Bilateral); Lumbar facet osteoarthritis  (Bilateral); Lumbar facet syndrome (Bilateral) (L>R); and Lumbar spinal stenosis (L3-4) on their problem list. Her primarily concern today is the Knee Pain (left) and Back Pain (lower)  Pain Assessment: Location: Left Knee Radiating: shoots inside of knee to the middle to the left Onset: More than a month ago Duration: Chronic pain Quality: Aching, Discomfort, Shooting Severity: 4 /10 (self-reported pain score)  Note: Reported level is inconsistent with clinical observations. Clinically the patient looks like a 2/10             When using our objective Pain Scale, levels between 6 and 10/10 are said to belong in an emergency room, as it progressively worsens from a 6/10, described as severely limiting, requiring emergency care not usually available at an outpatient pain management facility. At a 6/10 level, communication becomes difficult and requires great effort. Assistance to reach the emergency department may be required. Facial flushing and profuse sweating along with potentially dangerous increases in heart rate and blood pressure will be evident. Effect on ADL: hard to stand for a long time Modifying factors: reposition ,procedure  Nancy Day comes in today for post-procedure evaluation after the treatment done on 09/15/2017.  Further details on both, my assessment(s), as well as the proposed treatment plan, please see below.  Post-Procedure Assessment  09/15/2017 Procedure: Diagnostic left intra-articular knee joint injection with local anesthetic and steroid, no sedation. Pre-procedure pain score:  6/10 Post-procedure pain score: 0/10 (100% relief) Influential Factors: BMI: 34.26 kg/m Intra-procedural challenges: None observed.         Assessment challenges: None detected.              Reported side-effects: None.        Post-procedural adverse reactions or complications: None reported         Sedation: No sedation used. When no sedatives are used, the analgesic levels obtained  are directly associated to the effectiveness of the local anesthetics.  However, when sedation is provided, the level of analgesia obtained during the initial 1 hour following the intervention, is believed to be the result of a combination of factors. These factors may include, but are not limited to: 1. The effectiveness of the local anesthetics used. 2. The effects of the analgesic(s) and/or anxiolytic(s) used. 3. The degree of discomfort experienced by the patient at the time of the procedure. 4. The patients ability and reliability in recalling and recording the events. 5. The presence and influence of possible secondary gains and/or psychosocial factors. Reported result: Relief experienced during the 1st hour after the procedure: 100 % (Ultra-Short Term Relief) Nancy Day has indicated area to have been numb during this time. Interpretative annotation: Clinically appropriate result. No IV Analgesic or Anxiolytic given, therefore benefits are completely due to Local Anesthetic effects.          Effects of local anesthetic: The analgesic effects attained during this period are directly associated to the localized infiltration of local anesthetics and therefore cary significant diagnostic value as to the etiological location, or anatomical origin, of the pain. Expected duration of relief is directly dependent on the pharmacodynamics of the local anesthetic used. Long-acting (4-6 hours) anesthetics used.  Reported result: Relief during the next 4 to 6 hour after the procedure: 90 % (Short-Term Relief)            Interpretative annotation: Clinically possible results. Analgesia during this period is likely to be Local Anesthetic-related.     Long-term benefit: Defined as the period of time past the expected duration of local anesthetics (1 hour for short-acting and 4-6 hours for long-acting). With the possible exception of prolonged sympathetic blockade from the local anesthetics, benefits during this  period are typically attributed to, or associated with, other factors such as analgesic sensory neuropraxia, antiinflammatory effects, or beneficial biochemical changes provided by agents other than the local anesthetics.  Reported result: Extended relief following procedure: 100 %(x 3 days, starting on the 3rd day.) (Long-Term Relief) Pain went back to pre-treatment levels after 10 days. Interpretative annotation: Clinically possible results. No benefit. No permanent benefit expected. Limited inflammation. Possible mechanical aggravating factors.          Current benefits: Defined as reported results that persistent at this point in time.   Analgesia: 0-25 % Swelling on medial compartment went away and has not returned. Lateral compartment continues to hurt. Function: Somewhat improved ROM: Somewhat improved Interpretative annotation: Recurrence of symptoms. Limited therapeutic benefit. Effective diagnostic intervention.          Interpretation: Results would suggest a successful diagnostic intervention.                  Plan:  Proceed with Hyalgan series.        Laboratory Chemistry  Inflammation Markers (CRP: Acute Phase) (ESR: Chronic Phase) Lab Results  Component Value Date   CRP 0.5 08/19/2017                 Rheumatology Markers No results found for: RF, ANA, LABURIC, URICUR, LYMEIGGIGMAB, LYMEABIGMQN              Renal Function Markers No results found for: BUN, CREATININE, GFRAA, GFRNONAA               Hepatic Function Markers No results found for: AST, ALT, ALBUMIN, ALKPHOS, HCVAB, AMYLASE, LIPASE, AMMONIA               Electrolytes Lab Results  Component Value Date  MG 2.0 08/19/2017                 Neuropathy Markers Lab Results  Component Value Date   VITAMINB12 569 08/19/2017                 Bone Pathology Markers Lab Results  Component Value Date   25OHVITD1 34 08/19/2017   25OHVITD2 2.0 08/19/2017   25OHVITD3 32 08/19/2017                  Coagulation Parameters No results found for: INR, LABPROT, APTT, PLT, DDIMER               Cardiovascular Markers No results found for: BNP, CKTOTAL, CKMB, TROPONINI, HGB, HCT               CA Markers No results found for: CEA, CA125, LABCA2               Note: Lab results reviewed.  Recent Diagnostic Imaging Results  US Abdomen Complete CLINICAL DATA:  One week history of abdominal pain  EXAM: ABDOMEN ULTRASOUND COMPLETE  COMPARISON:  CT abdomen and pelvis December 31, 2016  FINDINGS: Gallbladder: No gallstones or wall thickening visualized. There is no pericholecystic fluid. No sonographic Murphy sign noted by sonographer.  Common bile duct: Diameter: 3 mm. No intrahepatic, common hepatic, or common bile duct dilatation.  Liver: No focal lesion identified. Within normal limits in parenchymal echogenicity. Portal vein is patent on color Doppler imaging with normal direction of blood flow towards the liver.  IVC: No abnormality visualized.  Pancreas: Visualized portion unremarkable. Portions of pancreas obscured by gas.  Spleen: Size and appearance within normal limits.  Right Kidney: Length: 11.4 cm. Echogenicity within normal limits. No mass or hydronephrosis visualized.  Left Kidney: Length: 10.7 cm. Echogenicity within normal limits. No mass or hydronephrosis visualized.  Abdominal aorta: No aneurysm visualized.  Other findings: No demonstrable ascites.  IMPRESSION: Portions of pancreas obscured by gas. Visualized portions of pancreas appear normal.  Study otherwise unremarkable.  Electronically Signed   By: Lowella Grip III M.D.   On: 09/22/2017 09:20  Complexity Note: Imaging results reviewed. Results shared with Nancy Day, using Layman's terms.                         Meds   Current Outpatient Medications:  .  aspirin EC 81 MG tablet, Take 81 mg by mouth daily., Disp: , Rfl:  .  BIOTIN PO, Take 15,000 mcg by mouth daily at 2 PM., Disp: ,  Rfl:  .  buPROPion (WELLBUTRIN) 75 MG tablet, Take 75 mg by mouth 2 (two) times daily., Disp: , Rfl:  .  Cholecalciferol (VITAMIN D3) 1000 units CAPS, Take by mouth daily at 6 (six) AM. , Disp: , Rfl:  .  Cinnamon 500 MG capsule, Take 500 mg by mouth daily. , Disp: , Rfl:  .  Coenzyme Q-10 200 MG CAPS, Take by mouth daily. , Disp: , Rfl:  .  cyclobenzaprine (FLEXERIL) 10 MG tablet, Take 10 mg by mouth as needed for muscle spasms. , Disp: , Rfl:  .  diltiazem (CARDIZEM CD) 360 MG 24 hr capsule, Take 360 mg by mouth daily. , Disp: , Rfl:  .  DULoxetine (CYMBALTA) 30 MG capsule, Take 30 mg by mouth 2 (two) times daily., Disp: , Rfl:  .  Garlic 7846 MG CAPS, Take by mouth daily at 2 PM., Disp: , Rfl:  .  lidocaine (XYLOCAINE) 2 % jelly, Apply topically as needed. , Disp: , Rfl:  .  losartan-hydrochlorothiazide (HYZAAR) 100-12.5 MG tablet, Take 1 tablet by mouth daily. , Disp: , Rfl:  .  niacin (NIASPAN) 1000 MG CR tablet, Take 1,000 mg by mouth at bedtime., Disp: , Rfl:  .  potassium chloride (K-DUR) 10 MEQ tablet, Take 10 mEq by mouth 2 (two) times daily., Disp: , Rfl:  .  rosuvastatin (CRESTOR) 20 MG tablet, Take 20 mg by mouth daily., Disp: , Rfl:  .  Turmeric POWD, by Does not apply route every morning., Disp: , Rfl:  .  zolpidem (AMBIEN) 10 MG tablet, Take 5 mg by mouth at bedtime as needed for sleep., Disp: , Rfl:   ROS  Constitutional: Denies any fever or chills Gastrointestinal: No reported hemesis, hematochezia, vomiting, or acute GI distress Musculoskeletal: Denies any acute onset joint swelling, redness, loss of ROM, or weakness Neurological: No reported episodes of acute onset apraxia, aphasia, dysarthria, agnosia, amnesia, paralysis, loss of coordination, or loss of consciousness  Allergies  Nancy Day is allergic to hydrocodone and percocet [oxycodone-acetaminophen].  [place]  Drug: Nancy Day  reports that she does not use drugs. Alcohol:  reports that she drinks  alcohol. Tobacco:  reports that she has quit smoking. she has never used smokeless tobacco. Medical:  has a past medical history of Allergy, Anxiety, Breast cancer ([place]) (1998), Coronary artery disease, DDD (degenerative disc disease), lumbar, Depression, Dyspnea, Fibromyalgia, GERD (gastroesophageal reflux disease), Hypertension, Spinal stenosis, and Vitamin D deficiency. Surgical: Nancy Day  has a past surgical history that includes Abdominal hysterectomy; Shoulder surgery; Toe Surgery; Colonoscopy with propofol (N/A, 11/21/2016); Mastectomy (Right, 1998); Breast biopsy (Right, 1998); Reduction mammaplasty (Bilateral, 2012); and Augmentation mammaplasty (Right, 2012). Family: family history is not on file.  Constitutional Exam  General appearance: Well nourished, well developed, and well hydrated. In no apparent acute distress Vitals:   09/30/17 0945  BP: (!) 161/77  Pulse: 97  Resp: 16  Temp: 98.5 F (36.9 C)  SpO2: 98%  Weight: 222 lb (100.7 kg)  Height: 5' 7.5" (1.715 m)   BMI Assessment: Estimated body mass index is 34.26 kg/m as calculated from the following:   Height as of this encounter: 5' 7.5" (1.715 m).   Weight as of this encounter: 222 lb (100.7 kg).  BMI interpretation table: BMI level Category Range association with higher incidence of chronic pain  <18 kg/m2 Underweight   18.5-24.9 kg/m2 Ideal body weight   25-29.9 kg/m2 Overweight Increased incidence by 20%  30-34.9 kg/m2 Obese (Class I) Increased incidence by 68%  35-39.9 kg/m2 Severe obesity (Class II) Increased incidence by 136%  >40 kg/m2 Extreme obesity (Class III) Increased incidence by 254%   BMI Readings from Last 4 Encounters:  09/30/17 34.26 kg/m  09/15/17 34.77 kg/m  09/02/17 34.93 kg/m  08/19/17 34.46 kg/m   Wt Readings from Last 4 Encounters:  09/30/17 222 lb (100.7 kg)  09/15/17 222 lb (100.7 kg)  09/02/17 223 lb (101.2 kg)  08/19/17 220 lb (99.8 kg)  Psych/Mental status: Alert, oriented  x 3 (person, place, & time)       Eyes: PERLA Respiratory: No evidence of acute respiratory distress  Cervical Spine Area Exam  Skin & Axial Inspection: No masses, redness, edema, swelling, or associated skin lesions Alignment: Symmetrical Functional ROM: Unrestricted ROM      Stability: No instability detected Muscle Tone/Strength: Functionally intact. No obvious neuro-muscular anomalies detected. Sensory (Neurological): Unimpaired Palpation: No palpable anomalies  Upper Extremity (UE) Exam    Side: Right upper extremity  Side: Left upper extremity  Skin & Extremity Inspection: Skin color, temperature, and hair growth are WNL. No peripheral edema or cyanosis. No masses, redness, swelling, asymmetry, or associated skin lesions. No contractures.  Skin & Extremity Inspection: Skin color, temperature, and hair growth are WNL. No peripheral edema or cyanosis. No masses, redness, swelling, asymmetry, or associated skin lesions. No contractures.  Functional ROM: Unrestricted ROM          Functional ROM: Unrestricted ROM          Muscle Tone/Strength: Functionally intact. No obvious neuro-muscular anomalies detected.  Muscle Tone/Strength: Functionally intact. No obvious neuro-muscular anomalies detected.  Sensory (Neurological): Unimpaired          Sensory (Neurological): Unimpaired          Palpation: No palpable anomalies              Palpation: No palpable anomalies              Specialized Test(s): Deferred         Specialized Test(s): Deferred          Thoracic Spine Area Exam  Skin & Axial Inspection: No masses, redness, or swelling Alignment: Symmetrical Functional ROM: Unrestricted ROM Stability: No instability detected Muscle Tone/Strength: Functionally intact. No obvious neuro-muscular anomalies detected. Sensory (Neurological): Unimpaired Muscle strength & Tone: No palpable anomalies  Lumbar Spine Area Exam  Skin & Axial Inspection: No masses, redness, or  swelling Alignment: Symmetrical Functional ROM: Limited ROM      Stability: No instability detected Muscle Tone/Strength: Functionally intact. No obvious neuro-muscular anomalies detected. Sensory (Neurological): Unimpaired Palpation: No palpable anomalies       Provocative Tests: Lumbar Hyperextension and rotation test: evaluation deferred today       Lumbar Lateral bending test: evaluation deferred today       Patrick's Maneuver: evaluation deferred today                    Gait & Posture Assessment  Ambulation: Patient ambulates using a cane Gait: Limited. Using assistive device to ambulate Posture: WNL   Lower Extremity Exam    Side: Right lower extremity  Side: Left lower extremity  Skin & Extremity Inspection: Skin color, temperature, and hair growth are WNL. No peripheral edema or cyanosis. No masses, redness, swelling, asymmetry, or associated skin lesions. No contractures.  Skin & Extremity Inspection: Skin color, temperature, and hair growth are WNL. No peripheral edema or cyanosis. No masses, redness, swelling, asymmetry, or associated skin lesions. No contractures.  Functional ROM: Unrestricted ROM          Functional ROM: Improved after treatment for knee joint  Muscle Tone/Strength: Functionally intact. No obvious neuro-muscular anomalies detected.  Muscle Tone/Strength: Functionally intact. No obvious neuro-muscular anomalies detected.  Sensory (Neurological): Unimpaired  Sensory (Neurological): Unimpaired  Palpation: No palpable anomalies  Palpation: No palpable anomalies   Assessment  Primary Diagnosis & Pertinent Problem List: The primary encounter diagnosis was Chronic knee pain (Primary Area of Pain) (Left). Diagnoses of Osteoarthritis of knee (Left), Chronic lower extremity pain (Secondary Area of Pain) (Bilateral) (L>R), Chronic neck pain (Tertiary Area of Pain) (Bilateral) (R>L), Chronic shoulder pain (Fourth Area of Pain) (Bilateral) (R>L), Chronic low back pain  (Fifth Area of Pain) (Bilateral) (L>R), and Chronic pain syndrome were also pertinent to this visit.  Status Diagnosis  Improving Persistent Improving 1. Chronic knee pain (Primary Area of Pain) (Left)  2. Osteoarthritis of knee (Left)   3. Chronic lower extremity pain (Secondary Area of Pain) (Bilateral) (L>R)   4. Chronic neck pain (Tertiary Area of Pain) (Bilateral) (R>L)   5. Chronic shoulder pain (Fourth Area of Pain) (Bilateral) (R>L)   6. Chronic low back pain (Fifth Area of Pain) (Bilateral) (L>R)   7. Chronic pain syndrome     Problems updated and reviewed during this visit: No problems updated. Plan of Care  Pharmacotherapy (Medications Ordered): No orders of the defined types were placed in this encounter. This SmartLink is deprecated. Use AVSMEDLIST instead to display the medication list for a patient. Medications administered today: Nancy Day had no medications administered during this visit.   Procedure Orders     KNEE INJECTION Lab Orders  No laboratory test(s) ordered today   Imaging Orders  No imaging studies ordered today   Referral Orders  No referral(s) requested today    Interventional management options: Planned, scheduled, and/or pending:   Therapeutic left IA Hyalgan Knee injection #1    Considering:   Diagnostic bilateral lumbar facet block Possible bilateral lumbar facet RFA Diagnostic bilateral SI joint block Possible bilateral sacroiliac joint RFA Diagnostic left sided lumbar epidural steroid injection Diagnostic bilateral transforaminal epidural steroid injection Diagnostic left intra-articular knee joint injection Diagnostic left genicular nerve block Possibleleft genicular nerve RFA Possibleleft series of 5 intra-articular Hyalgan knee injections Diagnostic bilateral intra-articular shoulder joint injection Diagnostic bilateral suprascapular nerve block Possible bilateral suprascapular nerve RFA Diagnostic  right-sided cervical epidural steroid injection Diagnostic bilateral cervical facet block Possible bilateral cervical facet RFA Diagnostic IV lidocaine infusion   Palliative PRN treatment(s):   Palliative left-sided intra-articular knee joint injection with local anesthetic and steroids. #2   Provider-requested follow-up: Return for Procedure (no sedation): (L) IA Hyalgaqn Knee injection #!Marland Kitchen  Future Appointments  Date Time Provider [place]  10/08/2017  8:00 AM Milinda Pointer, MD [facility] None   Primary Care Physician: Idelle Crouch, MD Location: [facility] Out[facility] Pain Management Facility Note by: Gaspar Cola, MD Date: 09/30/2017; Time: 10:51 AM

## 2017-10-08 ENCOUNTER — Encounter: Payer: Self-pay | Admitting: Pain Medicine

## 2017-10-08 ENCOUNTER — Other Ambulatory Visit: Payer: Self-pay

## 2017-10-08 ENCOUNTER — Ambulatory Visit: Payer: Medicare Other | Attending: Pain Medicine | Admitting: Pain Medicine

## 2017-10-08 VITALS — BP 127/84 | HR 74 | Temp 97.9°F | Resp 16 | Ht 67.5 in | Wt 222.0 lb

## 2017-10-08 DIAGNOSIS — G8929 Other chronic pain: Secondary | ICD-10-CM | POA: Diagnosis not present

## 2017-10-08 DIAGNOSIS — Z9889 Other specified postprocedural states: Secondary | ICD-10-CM | POA: Diagnosis not present

## 2017-10-08 DIAGNOSIS — Z9011 Acquired absence of right breast and nipple: Secondary | ICD-10-CM | POA: Diagnosis not present

## 2017-10-08 DIAGNOSIS — M25562 Pain in left knee: Secondary | ICD-10-CM | POA: Diagnosis not present

## 2017-10-08 DIAGNOSIS — M1712 Unilateral primary osteoarthritis, left knee: Secondary | ICD-10-CM

## 2017-10-08 DIAGNOSIS — Z885 Allergy status to narcotic agent status: Secondary | ICD-10-CM | POA: Diagnosis not present

## 2017-10-08 MED ORDER — LIDOCAINE HCL (PF) 1 % IJ SOLN
5.0000 mL | Freq: Once | INTRAMUSCULAR | Status: AC
  Administered 2017-10-08: 5 mL

## 2017-10-08 MED ORDER — ROPIVACAINE HCL 2 MG/ML IJ SOLN
INTRAMUSCULAR | Status: AC
  Filled 2017-10-08: qty 10

## 2017-10-08 MED ORDER — LIDOCAINE HCL (PF) 1 % IJ SOLN
INTRAMUSCULAR | Status: AC
  Filled 2017-10-08: qty 5

## 2017-10-08 MED ORDER — ROPIVACAINE HCL 2 MG/ML IJ SOLN
2.0000 mL | Freq: Once | INTRAMUSCULAR | Status: AC
  Administered 2017-10-08: 10 mL via INTRA_ARTICULAR

## 2017-10-08 MED ORDER — SODIUM HYALURONATE (VISCOSUP) 20 MG/2ML IX SOSY
2.0000 mL | PREFILLED_SYRINGE | Freq: Once | INTRA_ARTICULAR | Status: AC
  Administered 2017-10-08: 2 mL via INTRA_ARTICULAR
  Filled 2017-10-08: qty 2

## 2017-10-08 NOTE — Patient Instructions (Addendum)
Pain Management Discharge Instructions  General Discharge Instructions :  If you need to reach your doctor call: Monday-Friday 8:00 am - 4:00 pm at [phone] or toll free [phone].  After clinic hours [phone] to have operator reach doctor.  Bring all of your medication bottles to all your appointments in the pain clinic.  To cancel or reschedule your appointment with Pain Management please remember to call 24 hours in advance to avoid a fee.  Refer to the educational materials which you have been given on: General Risks, I had my Procedure. Discharge Instructions, Post Sedation.  Post Procedure Instructions:  The drugs you were given will stay in your system until tomorrow, so for the next 24 hours you should not drive, make any legal decisions or drink any alcoholic beverages.  You may eat anything you prefer, but it is better to start with liquids then soups and crackers, and gradually work up to solid foods.  Please notify your doctor immediately if you have any unusual bleeding, trouble breathing or pain that is not related to your normal pain.  Depending on the type of procedure that was done, some parts of your body may feel week and/or numb.  This usually clears up by tonight or the next day.  Walk with the use of an assistive device or accompanied by an adult for the 24 hours.  You may use ice on the affected area for the first 24 hours.  Put ice in a Ziploc bag and cover with a towel and place against area 15 minutes on 15 minutes off.  You may switch to heat after 24 hours.Sodium Hyaluronate intra-articular injection What is this medicine? SODIUM HYALURONATE (SOE dee um hye al yoor ON ate) is used to treat pain in the knee due to osteoarthritis. This medicine may be used for other purposes; ask your health care provider or pharmacist if you have questions. COMMON BRAND NAME(S): Amvisc, DUROLANE, Euflexxa, GELSYN-3, Hyalgan, Monovisc, Orthovisc, Supartz, Supartz  FX What should I tell my health care provider before I take this medicine? They need to know if you have any of these conditions: -bleeding disorders -glaucoma -infection in the knee joint -skin conditions or sensitivity -skin infection -an unusual allergic reaction to sodium hyaluronate, other medicines, foods, dyes, or preservatives. Different brands of sodium hyaluronate contain different allergens. Some may contain egg. Talk to your doctor about your allergies to make sure that you get the right product. -pregnant or trying to get pregnant -breast-feeding How should I use this medicine? This medicine is for injection into the knee joint. It is given by a health care professional in a hospital or clinic setting. Talk to your pediatrician regarding the use of this medicine in children. Special care may be needed. Overdosage: If you think you have taken too much of this medicine contact a poison control center or emergency room at once. NOTE: This medicine is only for you. Do not share this medicine with others. What if I miss a dose? This does not apply. What may interact with this medicine? Interactions are not expected. This list may not describe all possible interactions. Give your health care provider a list of all the medicines, herbs, non-prescription drugs, or dietary supplements you use. Also tell them if you smoke, drink alcohol, or use illegal drugs. Some items may interact with your medicine. What should I watch for while using this medicine? Tell your doctor or healthcare professional if your symptoms do not start to get better or if  they get worse. If receiving this medicine for osteoarthritis, limit your activity after you receive your injection. Avoid physical activity for 48 hours following your injection to keep your knee from swelling. Do not stand on your feet for more than 1 hour at a time during the first 48 hours following your injection. Ask your doctor or healthcare  professional about when you can begin major physical activity again. What side effects may I notice from receiving this medicine? Side effects that you should report to your doctor or health care professional as soon as possible: -allergic reactions like skin rash, itching or hives, swelling of the face, lips, or tongue -dizziness -facial flushing -pain, tingling, numbness in the hands or feet -vision changes if received this medicine during eye surgery Side effects that usually do not require medical attention (report to your doctor or health care professional if they continue or are bothersome): -back pain -bruising at site where injected -chills -diarrhea -fever -headache -joint pain -joint stiffness -joint swelling -muscle cramps -muscle pain -nausea, vomiting -pain, redness, or irritation at site where injected -weak or tired This list may not describe all possible side effects. Call your doctor for medical advice about side effects. You may report side effects to FDA at 1-800-FDA-1088. Where should I keep my medicine? This drug is given in a hospital or clinic and will not be stored at home. NOTE: This sheet is a summary. It may not cover all possible information. If you have questions about this medicine, talk to your doctor, pharmacist, or health care provider.  2018 Elsevier/Gold Standard (2015-11-08 08:34:51) ____________________________________________________________________________________________  Post-Procedure instructions Instructions:  Apply ice: Fill a plastic sandwich bag with crushed ice. Cover it with a small towel and apply to injection site. Apply for 15 minutes then remove x 15 minutes. Repeat sequence on day of procedure, until you go to bed. The purpose is to minimize swelling and discomfort after procedure.  Apply heat: Apply heat to procedure site starting the day following the procedure. The purpose is to treat any soreness and discomfort from the  procedure.  Food intake: Start with clear liquids (like water) and advance to regular food, as tolerated.   Physical activities: Keep activities to a minimum for the first 8 hours after the procedure.   Driving: If you have received any sedation, you are not allowed to drive for 24 hours after your procedure.  Blood thinner: Restart your blood thinner 6 hours after your procedure. (Only for those taking blood thinners)  Insulin: As soon as you can eat, you may resume your normal dosing schedule. (Only for those taking insulin)  Infection prevention: Keep procedure site clean and dry.  Post-procedure Pain Diary: Extremely important that this be done correctly and accurately. Recorded information will be used to determine the next step in treatment.  Pain evaluated is that of treated area only. Do not include pain from an untreated area.  Complete every hour, on the hour, for the initial 8 hours. Set an alarm to help you do this part accurately.  Do not go to sleep and have it completed later. It will not be accurate.  Follow-up appointment: Keep your follow-up appointment after the procedure. Usually 2 weeks for most procedures. (6 weeks in the case of radiofrequency.) Bring you pain diary.  Expect:  From numbing medicine (AKA: Local Anesthetics): Numbness or decrease in pain.  Onset: Full effect within 15 minutes of injected.  Duration: It will depend on the type of local anesthetic used.  On the average, 1 to 8 hours.   From steroids: Decrease in swelling or inflammation. Once inflammation is improved, relief of the pain will follow.  Onset of benefits: Depends on the amount of swelling present. The more swelling, the longer it will take for the benefits to be seen. In some cases, up to 10 days.  Duration: Steroids will stay in the system x 2 weeks. Duration of benefits will depend on multiple posibilities including persistent irritating factors.  From procedure: Some discomfort  is to be expected once the numbing medicine wears off. This should be minimal if ice and heat are applied as instructed. Call if:  You experience numbness and weakness that gets worse with time, as opposed to wearing off.  New onset bowel or bladder incontinence. (Spinal procedures only)  Emergency Numbers:  [facility] business hours (Monday - Thursday, 8:00 AM - 4:00 PM) (Friday, 9:00 AM - 12:00 Noon): [phone]  After hours: [phone] ____________________________________________________________________________________________

## 2017-10-08 NOTE — Progress Notes (Signed)
Patient's Name: Nancy Day  MRN: [id number]  Referring Provider: Idelle Crouch, MD  DOB: Nov 11, 1950  PCP: Idelle Crouch, MD  DOS: 10/08/2017  Note by: Gaspar Cola, MD  Service setting: Ambulatory outpatient  Specialty: Interventional Pain Management  Patient type: Established  Location: [facility] (AMB) Pain Management Facility  Visit type: Interventional Procedure   Primary Reason for Visit: Interventional Pain Management Treatment. CC: Knee Pain (left)  Procedure:  Anesthesia, Analgesia, Anxiolysis:  Type: Therapeutic Intra-Articular Hyalgan Knee Injection #1 Region: Lateral infrapatellar Knee Region Level: Knee Joint Laterality: Left knee  Type: Local Anesthesia Local Anesthetic: Lidocaine 1% Route: Infiltration ([place]/IM) IV Access: Declined Sedation: Declined  Indication(s): Analgesia           Indications: 1. Chronic knee pain (Primary Area of Pain) (Left)   2. Osteoarthritis of knee (Left)    Pain Score: Pre-procedure: 4 /10 Post-procedure: 0-No pain/10  Pre-op Assessment:  Nancy Day is a 66 y.o. (year old), female patient, seen today for interventional treatment. She  has a past surgical history that includes Abdominal hysterectomy; Shoulder surgery; Toe Surgery; Colonoscopy with propofol (N/A, 11/21/2016); Mastectomy (Right, 1998); Breast biopsy (Right, 1998); Reduction mammaplasty (Bilateral, 2012); and Augmentation mammaplasty (Right, 2012). Nancy Day has a current medication list which includes the following prescription(s): aspirin ec, biotin, bupropion, vitamin d3, cinnamon, coenzyme q-10, cyclobenzaprine, diltiazem, duloxetine, garlic, lidocaine, losartan-hydrochlorothiazide, niacin, potassium chloride, rosuvastatin, turmeric, and zolpidem. Her primarily concern today is the Knee Pain (left)  Initial Vital Signs: There were no vitals taken for this visit. BMI: Estimated body mass index is 34.26 kg/m as calculated from the following:   Height as of this  encounter: 5' 7.5" (1.715 m).   Weight as of this encounter: 222 lb (100.7 kg).  Risk Assessment: Allergies: Reviewed. She is allergic to hydrocodone and percocet [oxycodone-acetaminophen].  Allergy Precautions: None required Coagulopathies: Reviewed. None identified.  Blood-thinner therapy: None at this time Active Infection(s): Reviewed. None identified. Nancy Day is afebrile  Site Confirmation: Nancy Day was asked to confirm the procedure and laterality before marking the site Procedure checklist: Completed Consent: Before the procedure and under the influence of no sedative(s), amnesic(s), or anxiolytics, the patient was informed of the treatment options, risks and possible complications. To fulfill our ethical and legal obligations, as recommended by the American Medical Association's Code of Ethics, I have informed the patient of my clinical impression; the nature and purpose of the treatment or procedure; the risks, benefits, and possible complications of the intervention; the alternatives, including doing nothing; the risk(s) and benefit(s) of the alternative treatment(s) or procedure(s); and the risk(s) and benefit(s) of doing nothing. The patient was provided information about the general risks and possible complications associated with the procedure. These may include, but are not limited to: failure to achieve desired goals, infection, bleeding, organ or nerve damage, allergic reactions, paralysis, and death. In addition, the patient was informed of those risks and complications associated to the procedure, such as failure to decrease pain; infection; bleeding; organ or nerve damage with subsequent damage to sensory, motor, and/or autonomic systems, resulting in permanent pain, numbness, and/or weakness of one or several areas of the body; allergic reactions; (i.e.: anaphylactic reaction); and/or death. Furthermore, the patient was informed of those risks and complications associated  with the medications. These include, but are not limited to: allergic reactions (i.e.: anaphylactic or anaphylactoid reaction(s)); adrenal axis suppression; blood sugar elevation that in diabetics may result in ketoacidosis or comma; water retention that in patients with  history of congestive heart failure may result in shortness of breath, pulmonary edema, and decompensation with resultant heart failure; weight gain; swelling or edema; medication-induced neural toxicity; particulate matter embolism and blood vessel occlusion with resultant organ, and/or nervous system infarction; and/or aseptic necrosis of one or more joints. Finally, the patient was informed that Medicine is not an exact science; therefore, there is also the possibility of unforeseen or unpredictable risks and/or possible complications that may result in a catastrophic outcome. The patient indicated having understood very clearly. We have given the patient no guarantees and we have made no promises. Enough time was given to the patient to ask questions, all of which were answered to the patient's satisfaction. Nancy Day has indicated that she wanted to continue with the procedure. Attestation: I, the ordering provider, attest that I have discussed with the patient the benefits, risks, side-effects, alternatives, likelihood of achieving goals, and potential problems during recovery for the procedure that I have provided informed consent. Date: 10/08/2017; Time: 6:51 AM  Pre-Procedure Preparation:  Monitoring: As per clinic protocol. Respiration, ETCO2, SpO2, BP, heart rate and rhythm monitor placed and checked for adequate function Safety Precautions: Patient was assessed for positional comfort and pressure points before starting the procedure. Time-out: I initiated and conducted the "Time-out" before starting the procedure, as per protocol. The patient was asked to participate by confirming the accuracy of the "Time Out" information.  Verification of the correct person, site, and procedure were performed and confirmed by me, the nursing staff, and the patient. "Time-out" conducted as per Joint Commission's Universal Protocol (UP.01.01.01). "Time-out" Date & Time: 10/08/2017; 0839 hrs.  Description of Procedure Process:   Position: Sitting Target Area: Knee Joint Approach: Just above the Lateral tibial plateau, lateral to the infrapatellar tendon. Area Prepped: Entire knee area, from the mid-thigh to the mid-shin. Prepping solution: ChloraPrep (2% chlorhexidine gluconate and 70% isopropyl alcohol) Safety Precautions: Aspiration looking for blood return was conducted prior to all injections. At no point did we inject any substances, as a needle was being advanced. No attempts were made at seeking any paresthesias. Safe injection practices and needle disposal techniques used. Medications properly checked for expiration dates. SDV (single dose vial) medications used. Description of the Procedure: Protocol guidelines were followed. The patient was placed in position over the fluoroscopy table. The target area was identified and the area prepped in the usual manner. Skin desensitized using vapocoolant spray. Skin & deeper tissues infiltrated with local anesthetic. Appropriate amount of time allowed to pass for local anesthetics to take effect. The procedure needles were then advanced to the target area. Proper needle placement secured. Negative aspiration confirmed. Solution injected in intermittent fashion, asking for systemic symptoms every 0.5cc of injectate. The needles were then removed and the area cleansed, making sure to leave some of the prepping solution back to take advantage of its long term bactericidal properties. Vitals:   10/08/17 0827 10/08/17 0832 10/08/17 0841 10/08/17 0844  BP: (!) 142/83 140/76 (!) 144/84 127/84  Pulse: 81 73 83 74  Resp: 16 16 16 16   Temp:      TempSrc:      SpO2: 100% 99% 100% 98%  Weight:       Height:        Start Time: 0840 hrs. End Time: 0844 hrs. Materials:  Needle(s) Type: Regular needle Gauge: 22G Length: 3.5-in Medication(s): We administered lidocaine (PF), Sodium Hyaluronate, and ropivacaine (PF) 2 mg/mL (0.2%). Please see chart orders for dosing details.  Imaging  Guidance:  Type of Imaging Technique: None used Indication(s): N/A Exposure Time: No patient exposure Contrast: None used. Fluoroscopic Guidance: N/A Ultrasound Guidance: N/A Interpretation: N/A  Antibiotic Prophylaxis:  Indication(s): None identified Antibiotic given: None  Post-operative Assessment:  EBL: None Complications: No immediate post-treatment complications observed by team, or reported by patient. Note: The patient tolerated the entire procedure well. A repeat set of vitals were taken after the procedure and the patient was kept under observation following institutional policy, for this type of procedure. Post-procedural neurological assessment was performed, showing return to baseline, prior to discharge. The patient was provided with post-procedure discharge instructions, including a section on how to identify potential problems. Should any problems arise concerning this procedure, the patient was given instructions to immediately contact us, at any time, without hesitation. In any case, we plan to contact the patient by telephone for a follow-up status report regarding this interventional procedure. Comments:  No additional relevant information.  Plan of Care   Imaging Orders  No imaging studies ordered today    Procedure Orders     KNEE INJECTION     KNEE INJECTION  Medications ordered for procedure: Meds ordered this encounter  Medications  . lidocaine (PF) (XYLOCAINE) 1 % injection 5 mL  . Sodium Hyaluronate SOSY 2 mL  . ropivacaine (PF) 2 mg/mL (0.2%) (NAROPIN) injection 2 mL   Medications administered: We administered lidocaine (PF), Sodium Hyaluronate, and ropivacaine  (PF) 2 mg/mL (0.2%).  See the medical record for exact dosing, route, and time of administration.  This SmartLink is deprecated. Use AVSMEDLIST instead to display the medication list for a patient. Disposition: Discharge home  Discharge Date & Time: 10/08/2017; 0850 hrs.   Physician-requested Follow-up: Return for Procedure (no sedation): (L) [place] Hyalgan #2. Future Appointments  Date Time Provider [place]  10/29/2017  1:45 PM Milinda Pointer, MD [facility] None   Primary Care Physician: Idelle Crouch, MD Location: [facility] Outpatient Pain Management Facility Note by: Gaspar Cola, MD Date: 10/08/2017; Time: 9:59 AM  Disclaimer:  Medicine is not an Chief Strategy Officer. The only guarantee in medicine is that nothing is guaranteed. It is important to note that the decision to proceed with this intervention was based on the information collected from the patient. The Data and conclusions were drawn from the patient's questionnaire, the interview, and the physical examination. Because the information was provided in large part by the patient, it cannot be guaranteed that it has not been purposely or unconsciously manipulated. Every effort has been made to obtain as much relevant data as possible for this evaluation. It is important to note that the conclusions that lead to this procedure are derived in large part from the available data. Always take into account that the treatment will also be dependent on availability of resources and existing treatment guidelines, considered by other Pain Management Practitioners as being common knowledge and practice, at the time of the intervention. For Medico-Legal purposes, it is also important to point out that variation in procedural techniques and pharmacological choices are the acceptable norm. The indications, contraindications, technique, and results of the above procedure should only be interpreted and judged by a Board-Certified Interventional  Pain Specialist with extensive familiarity and expertise in the same exact procedure and technique.

## 2017-10-09 ENCOUNTER — Telehealth: Payer: Self-pay

## 2017-10-09 NOTE — Telephone Encounter (Signed)
Post procedure phone call.  LM 

## 2017-10-28 NOTE — Progress Notes (Signed)
Patient's Name: Nancy Day  MRN: [id number]  Referring Provider: Idelle Crouch, MD  DOB: 06/06/1951  PCP: Idelle Crouch, MD  DOS: 10/29/2017  Note by: Gaspar Cola, MD  Service setting: Ambulatory outpatient  Specialty: Interventional Pain Management  Patient type: Established  Location: [facility] (AMB) Pain Management Facility  Visit type: Interventional Procedure   Primary Reason for Visit: Interventional Pain Management Treatment. CC: Knee Pain (left)  Procedure:  Anesthesia, Analgesia, Anxiolysis:  Type: Therapeutic Intra-Articular Local anesthetic and steroid Knee Injection #2 Region: Medial infrapatellar Knee Region Level: Knee Joint Laterality: Left knee  Type: Local Anesthesia Local Anesthetic: Lidocaine 1% Route: Infiltration ([place]/IM) IV Access: Declined Sedation: Declined  Indication(s): Analgesia           Indications: 1. Chronic knee pain (Primary Area of Pain) (Left)   2. Osteoarthritis of knee (Left)   3. Tricompartment osteoarthritis of knee (Left)    Pain Score: Pre-procedure: 4 /10 Post-procedure: 0-No pain/10  Pre-op Assessment:  Nancy Day is a 66 y.o. (year old), female patient, seen today for interventional treatment. She  has a past surgical history that includes Abdominal hysterectomy; Shoulder surgery; Toe Surgery; Colonoscopy with propofol (N/A, 11/21/2016); Mastectomy (Right, 1998); Breast biopsy (Right, 1998); Reduction mammaplasty (Bilateral, 2012); and Augmentation mammaplasty (Right, 2012). Nancy Day has a current medication list which includes the following prescription(s): aspirin ec, biotin, bupropion, vitamin d3, cinnamon, coenzyme q-10, cyclobenzaprine, diltiazem, duloxetine, garlic, lidocaine, losartan-hydrochlorothiazide, niacin, potassium chloride, rosuvastatin, turmeric, and zolpidem, and the following Facility-Administered Medications: lidocaine (pf), ropivacaine (pf) 2 mg/ml (0.2%), and sodium hyaluronate. Her primarily concern today is  the Knee Pain (left)  Initial Vital Signs: There were no vitals taken for this visit. BMI: Estimated body mass index is 34.26 kg/m as calculated from the following:   Height as of this encounter: 5' 7.5" (1.715 m).   Weight as of this encounter: 222 lb (100.7 kg).  Risk Assessment: Allergies: Reviewed. She is allergic to hydrocodone and percocet [oxycodone-acetaminophen].  Allergy Precautions: None required Coagulopathies: Reviewed. None identified.  Blood-thinner therapy: None at this time Active Infection(s): Reviewed. None identified. Nancy Day is afebrile  Site Confirmation: Nancy Day was asked to confirm the procedure and laterality before marking the site Procedure checklist: Completed Consent: Before the procedure and under the influence of no sedative(s), amnesic(s), or anxiolytics, the patient was informed of the treatment options, risks and possible complications. To fulfill our ethical and legal obligations, as recommended by the American Medical Association's Code of Ethics, I have informed the patient of my clinical impression; the nature and purpose of the treatment or procedure; the risks, benefits, and possible complications of the intervention; the alternatives, including doing nothing; the risk(s) and benefit(s) of the alternative treatment(s) or procedure(s); and the risk(s) and benefit(s) of doing nothing. The patient was provided information about the general risks and possible complications associated with the procedure. These may include, but are not limited to: failure to achieve desired goals, infection, bleeding, organ or nerve damage, allergic reactions, paralysis, and death. In addition, the patient was informed of those risks and complications associated to the procedure, such as failure to decrease pain; infection; bleeding; organ or nerve damage with subsequent damage to sensory, motor, and/or autonomic systems, resulting in permanent pain, numbness, and/or  weakness of one or several areas of the body; allergic reactions; (i.e.: anaphylactic reaction); and/or death. Furthermore, the patient was informed of those risks and complications associated with the medications. These include, but are not limited to: allergic reactions (  i.e.: anaphylactic or anaphylactoid reaction(s)); adrenal axis suppression; blood sugar elevation that in diabetics may result in ketoacidosis or comma; water retention that in patients with history of congestive heart failure may result in shortness of breath, pulmonary edema, and decompensation with resultant heart failure; weight gain; swelling or edema; medication-induced neural toxicity; particulate matter embolism and blood vessel occlusion with resultant organ, and/or nervous system infarction; and/or aseptic necrosis of one or more joints. Finally, the patient was informed that Medicine is not an exact science; therefore, there is also the possibility of unforeseen or unpredictable risks and/or possible complications that may result in a catastrophic outcome. The patient indicated having understood very clearly. We have given the patient no guarantees and we have made no promises. Enough time was given to the patient to ask questions, all of which were answered to the patient's satisfaction. Nancy Day has indicated that she wanted to continue with the procedure. Attestation: I, the ordering provider, attest that I have discussed with the patient the benefits, risks, side-effects, alternatives, likelihood of achieving goals, and potential problems during recovery for the procedure that I have provided informed consent. Date: 10/29/2017; Time: 1:46 PM  Pre-Procedure Preparation:  Monitoring: As per clinic protocol. Respiration, ETCO2, SpO2, BP, heart rate and rhythm monitor placed and checked for adequate function Safety Precautions: Patient was assessed for positional comfort and pressure points before starting the  procedure. Time-out: I initiated and conducted the "Time-out" before starting the procedure, as per protocol. The patient was asked to participate by confirming the accuracy of the "Time Out" information. Verification of the correct person, site, and procedure were performed and confirmed by me, the nursing staff, and the patient. "Time-out" conducted as per Joint Commission's Universal Protocol (UP.01.01.01). "Time-out" Date & Time: 10/29/2017; 1405 hrs.  Description of Procedure Process:   Position: Sitting Target Area: Knee Joint Approach: Just above the Medial tibial plateau, lateral to the infrapatellar tendon. Area Prepped: Entire knee area, from the mid-thigh to the mid-shin. Prepping solution: ChloraPrep (2% chlorhexidine gluconate and 70% isopropyl alcohol) Safety Precautions: Aspiration looking for blood return was conducted prior to all injections. At no point did we inject any substances, as a needle was being advanced. No attempts were made at seeking any paresthesias. Safe injection practices and needle disposal techniques used. Medications properly checked for expiration dates. SDV (single dose vial) medications used. Description of the Procedure: Protocol guidelines were followed. The patient was placed in position over the fluoroscopy table. The target area was identified and the area prepped in the usual manner. Skin desensitized using vapocoolant spray. Skin & deeper tissues infiltrated with local anesthetic. Appropriate amount of time allowed to pass for local anesthetics to take effect. The procedure needles were then advanced to the target area. Proper needle placement secured. Negative aspiration confirmed. Solution injected in intermittent fashion, asking for systemic symptoms every 0.5cc of injectate. The needles were then removed and the area cleansed, making sure to leave some of the prepping solution back to take advantage of its long term bactericidal properties. Vitals:    10/29/17 1347 10/29/17 1412  BP: 126/76 128/76  Pulse: 80 77  Resp: 16 16  Temp: 97.9 F (36.6 C) 97.9 F (36.6 C)  TempSrc: Oral Oral  SpO2: 100% 99%  Weight: 222 lb (100.7 kg)   Height: 5' 7.5" (1.715 m)     Start Time: 1405 hrs. End Time: 1407 hrs. Materials:  Needle(s) Type: Regular needle Gauge: 25G Length: 1.5-in Medication(s): Marlene Lard had no medications  administered during this visit. Please see chart orders for dosing details.  Imaging Guidance:  Type of Imaging Technique: None used Indication(s): N/A Exposure Time: No patient exposure Contrast: None used. Fluoroscopic Guidance: N/A Ultrasound Guidance: N/A Interpretation: N/A  Antibiotic Prophylaxis:  Indication(s): None identified Antibiotic given: None  Post-operative Assessment:  EBL: None Complications: No immediate post-treatment complications observed by team, or reported by patient. Note: The patient tolerated the entire procedure well. A repeat set of vitals were taken after the procedure and the patient was kept under observation following institutional policy, for this type of procedure. Post-procedural neurological assessment was performed, showing return to baseline, prior to discharge. The patient was provided with post-procedure discharge instructions, including a section on how to identify potential problems. Should any problems arise concerning this procedure, the patient was given instructions to immediately contact us, at any time, without hesitation. In any case, we plan to contact the patient by telephone for a follow-up status report regarding this interventional procedure. Comments:  No additional relevant information.  Plan of Care   Imaging Orders  No imaging studies ordered today    Procedure Orders     KNEE INJECTION     KNEE INJECTION  Medications ordered for procedure: Meds ordered this encounter  Medications  . lidocaine (PF) (XYLOCAINE) 1 % injection 5 mL  . ropivacaine  (PF) 2 mg/mL (0.2%) (NAROPIN) injection 2 mL  . Sodium Hyaluronate SOSY 2 mL   Medications administered: Marlene Lard had no medications administered during this visit.  See the medical record for exact dosing, route, and time of administration.  New Prescriptions   No medications on file   Disposition: Discharge home  Discharge Date & Time: 10/29/2017; 1415 hrs.   Physician-requested Follow-up: Return for Procedure (no sedation): (L) IA Hyalgan #3. No future appointments. Primary Care Physician: Idelle Crouch, MD Location: [facility] Note by: Gaspar Cola, MD Date: 10/29/2017; Time: 2:17 PM  Disclaimer:  Medicine is not an Chief Strategy Officer. The only guarantee in medicine is that nothing is guaranteed. It is important to note that the decision to proceed with this intervention was based on the information collected from the patient. The Data and conclusions were drawn from the patient's questionnaire, the interview, and the physical examination. Because the information was provided in large part by the patient, it cannot be guaranteed that it has not been purposely or unconsciously manipulated. Every effort has been made to obtain as much relevant data as possible for this evaluation. It is important to note that the conclusions that lead to this procedure are derived in large part from the available data. Always take into account that the treatment will also be dependent on availability of resources and existing treatment guidelines, considered by other Pain Management Practitioners as being common knowledge and practice, at the time of the intervention. For Medico-Legal purposes, it is also important to point out that variation in procedural techniques and pharmacological choices are the acceptable norm. The indications, contraindications, technique, and results of the above procedure should only be interpreted and judged by a Board-Certified Interventional  Pain Specialist with extensive familiarity and expertise in the same exact procedure and technique.

## 2017-10-29 ENCOUNTER — Encounter: Payer: Self-pay | Admitting: Pain Medicine

## 2017-10-29 ENCOUNTER — Other Ambulatory Visit: Payer: Self-pay

## 2017-10-29 ENCOUNTER — Ambulatory Visit: Payer: Medicare Other | Attending: Pain Medicine | Admitting: Pain Medicine

## 2017-10-29 VITALS — BP 128/76 | HR 77 | Temp 97.9°F | Resp 16 | Ht 67.5 in | Wt 222.0 lb

## 2017-10-29 DIAGNOSIS — M25562 Pain in left knee: Secondary | ICD-10-CM | POA: Diagnosis not present

## 2017-10-29 DIAGNOSIS — M1712 Unilateral primary osteoarthritis, left knee: Secondary | ICD-10-CM | POA: Diagnosis not present

## 2017-10-29 DIAGNOSIS — G8929 Other chronic pain: Secondary | ICD-10-CM

## 2017-10-29 MED ORDER — LIDOCAINE HCL (PF) 1 % IJ SOLN
5.0000 mL | Freq: Once | INTRAMUSCULAR | Status: AC
  Administered 2017-10-29: 5 mL

## 2017-10-29 MED ORDER — LIDOCAINE HCL (PF) 1 % IJ SOLN
INTRAMUSCULAR | Status: AC
  Filled 2017-10-29: qty 5

## 2017-10-29 MED ORDER — ROPIVACAINE HCL 2 MG/ML IJ SOLN
2.0000 mL | Freq: Once | INTRAMUSCULAR | Status: AC
  Administered 2017-10-29: 2 mL via INTRA_ARTICULAR

## 2017-10-29 MED ORDER — ROPIVACAINE HCL 2 MG/ML IJ SOLN
INTRAMUSCULAR | Status: AC
  Filled 2017-10-29: qty 10

## 2017-10-29 MED ORDER — SODIUM HYALURONATE (VISCOSUP) 20 MG/2ML IX SOSY
2.0000 mL | PREFILLED_SYRINGE | Freq: Once | INTRA_ARTICULAR | Status: AC
  Administered 2017-10-29: 2 mL via INTRA_ARTICULAR
  Filled 2017-10-29: qty 2

## 2017-10-29 NOTE — Progress Notes (Signed)
Safety precautions to be maintained throughout the outpatient stay will include: orient to surroundings, keep bed in low position, maintain call bell within reach at all times, provide assistance with transfer out of bed and ambulation.  

## 2017-10-29 NOTE — Patient Instructions (Signed)

## 2017-10-30 ENCOUNTER — Telehealth: Payer: Self-pay

## 2017-10-30 NOTE — Telephone Encounter (Signed)
Denies any needs at this time. Instructed pt that it could take several injection before the knee gets better. She states she is sore and knee is with shooting pain Instructed to call if needed

## 2017-11-11 NOTE — Progress Notes (Signed)
Patient's Name: Nancy Day  MRN: [id number]  Referring Provider: Idelle Crouch, MD  DOB: 03/17/1951  PCP: Idelle Crouch, MD  DOS: 11/12/2017  Note by: Gaspar Cola, MD  Service setting: Ambulatory outpatient  Specialty: Interventional Pain Management  Patient type: Established  Location: [facility] (AMB) Pain Management Facility  Visit type: Interventional Procedure   Primary Reason for Visit: Interventional Pain Management Treatment. CC: Knee Pain (left)  Procedure:  Anesthesia, Analgesia, Anxiolysis:  Type: Therapeutic Intra-Articular Hyalgan Knee Injection #3  Region: Lateral infrapatellar Knee Region Level: Knee Joint Laterality: Left knee  Type: Local Anesthesia Local Anesthetic: Lidocaine 1% Route: Infiltration ([place]/IM) IV Access: Declined Sedation: Declined  Indication(s): Analgesia           Indications: 1. Chronic knee pain (Primary Area of Pain) (Left)   2. Osteoarthritis of knee (Left)    Pain Score: Pre-procedure: 1 /10 Post-procedure: 0-No pain/10  Pre-op Assessment:  Nancy Day is a 67 y.o. (year old), female patient, seen today for interventional treatment. She  has a past surgical history that includes Abdominal hysterectomy; Shoulder surgery; Toe Surgery; Colonoscopy with propofol (N/A, 11/21/2016); Mastectomy (Right, 1998); Breast biopsy (Right, 1998); Reduction mammaplasty (Bilateral, 2012); and Augmentation mammaplasty (Right, 2012). Nancy Day has a current medication list which includes the following prescription(s): aspirin ec, biotin, bupropion, vitamin d3, cinnamon, coenzyme q-10, cyclobenzaprine, diltiazem, duloxetine, garlic, lidocaine, losartan-hydrochlorothiazide, niacin, potassium chloride, rosuvastatin, turmeric, and zolpidem. Her primarily concern today is the Knee Pain (left)  Initial Vital Signs: see below BMI: Estimated body mass index is 34.61 kg/m as calculated from the following:   Height as of this encounter: 5\' 7"  (1.702 m).   Weight as  of this encounter: 221 lb (100.2 kg).  Risk Assessment: Allergies: Reviewed. She is allergic to hydrocodone and percocet [oxycodone-acetaminophen].  Allergy Precautions: None required Coagulopathies: Reviewed. None identified.  Blood-thinner therapy: None at this time Active Infection(s): Reviewed. None identified. Nancy Day is afebrile  Site Confirmation: Nancy Day was asked to confirm the procedure and laterality before marking the site Procedure checklist: Completed Consent: Before the procedure and under the influence of no sedative(s), amnesic(s), or anxiolytics, the patient was informed of the treatment options, risks and possible complications. To fulfill our ethical and legal obligations, as recommended by the American Medical Association's Code of Ethics, I have informed the patient of my clinical impression; the nature and purpose of the treatment or procedure; the risks, benefits, and possible complications of the intervention; the alternatives, including doing nothing; the risk(s) and benefit(s) of the alternative treatment(s) or procedure(s); and the risk(s) and benefit(s) of doing nothing. The patient was provided information about the general risks and possible complications associated with the procedure. These may include, but are not limited to: failure to achieve desired goals, infection, bleeding, organ or nerve damage, allergic reactions, paralysis, and death. In addition, the patient was informed of those risks and complications associated to the procedure, such as failure to decrease pain; infection; bleeding; organ or nerve damage with subsequent damage to sensory, motor, and/or autonomic systems, resulting in permanent pain, numbness, and/or weakness of one or several areas of the body; allergic reactions; (i.e.: anaphylactic reaction); and/or death. Furthermore, the patient was informed of those risks and complications associated with the medications. These include, but are  not limited to: allergic reactions (i.e.: anaphylactic or anaphylactoid reaction(s)); adrenal axis suppression; blood sugar elevation that in diabetics may result in ketoacidosis or comma; water retention that in patients with history of congestive heart failure  may result in shortness of breath, pulmonary edema, and decompensation with resultant heart failure; weight gain; swelling or edema; medication-induced neural toxicity; particulate matter embolism and blood vessel occlusion with resultant organ, and/or nervous system infarction; and/or aseptic necrosis of one or more joints. Finally, the patient was informed that Medicine is not an exact science; therefore, there is also the possibility of unforeseen or unpredictable risks and/or possible complications that may result in a catastrophic outcome. The patient indicated having understood very clearly. We have given the patient no guarantees and we have made no promises. Enough time was given to the patient to ask questions, all of which were answered to the patient's satisfaction. Nancy Day has indicated that she wanted to continue with the procedure. Attestation: I, the ordering provider, attest that I have discussed with the patient the benefits, risks, side-effects, alternatives, likelihood of achieving goals, and potential problems during recovery for the procedure that I have provided informed consent. Date: 11/12/2017; Time: 5:12 PM  Pre-Procedure Preparation:  Monitoring: As per clinic protocol. Respiration, ETCO2, SpO2, BP, heart rate and rhythm monitor placed and checked for adequate function Safety Precautions: Patient was assessed for positional comfort and pressure points before starting the procedure. Time-out: I initiated and conducted the "Time-out" before starting the procedure, as per protocol. The patient was asked to participate by confirming the accuracy of the "Time Out" information. Verification of the correct person, site, and  procedure were performed and confirmed by me, the nursing staff, and the patient. "Time-out" conducted as per Joint Commission's Universal Protocol (UP.01.01.01). "Time-out" Date & Time: 11/12/2017; 0942 hrs.  Description of Procedure Process:   Position: Sitting Target Area: Knee Joint Approach: Just above the Lateral tibial plateau, lateral to the infrapatellar tendon. Area Prepped: Entire knee area, from the mid-thigh to the mid-shin. Prepping solution: ChloraPrep (2% chlorhexidine gluconate and 70% isopropyl alcohol) Safety Precautions: Aspiration looking for blood return was conducted prior to all injections. At no point did we inject any substances, as a needle was being advanced. No attempts were made at seeking any paresthesias. Safe injection practices and needle disposal techniques used. Medications properly checked for expiration dates. SDV (single dose vial) medications used. Description of the Procedure: Protocol guidelines were followed. The patient was placed in position over the fluoroscopy table. The target area was identified and the area prepped in the usual manner. Skin desensitized using vapocoolant spray. Skin & deeper tissues infiltrated with local anesthetic. Appropriate amount of time allowed to pass for local anesthetics to take effect. The procedure needles were then advanced to the target area. Proper needle placement secured. Negative aspiration confirmed. Solution injected in intermittent fashion, asking for systemic symptoms every 0.5cc of injectate. The needles were then removed and the area cleansed, making sure to leave some of the prepping solution back to take advantage of its long term bactericidal properties. Vitals:   11/12/17 0930 11/12/17 0945  BP: 125/87 140/75  Pulse: 92 94  Resp: 16 18  Temp: 97.9 F (36.6 C)   TempSrc: Oral   SpO2: 100% 100%  Weight: 221 lb (100.2 kg)   Height: 5\' 7"  (1.702 m)     Start Time: 0942 hrs.  Materials:  Needle(s) Type:  Regular needle Gauge: 25G Length: 1.5-in Medication(s): We administered lidocaine (PF), ropivacaine (PF) 2 mg/mL (0.2%), and Sodium Hyaluronate. Please see chart orders for dosing details.  Imaging Guidance:  Type of Imaging Technique: None used Indication(s): N/A Exposure Time: No patient exposure Contrast: None used. Fluoroscopic Guidance: N/A  Ultrasound Guidance: N/A Interpretation: N/A  Antibiotic Prophylaxis:  Indication(s): None identified Antibiotic given: None  Post-operative Assessment:  Post-procedure Vital Signs:  Pulse Rate: 94 Temp: 97.9 F (36.6 C) Resp: 18 BP: 140/75 SpO2: 100 % EBL: None Complications: No immediate post-treatment complications observed by team, or reported by patient. Note: The patient tolerated the entire procedure well. A repeat set of vitals were taken after the procedure and the patient was kept under observation following institutional policy, for this type of procedure. Post-procedural neurological assessment was performed, showing return to baseline, prior to discharge. The patient was provided with post-procedure discharge instructions, including a section on how to identify potential problems. Should any problems arise concerning this procedure, the patient was given instructions to immediately contact us, at any time, without hesitation. In any case, we plan to contact the patient by telephone for a follow-up status report regarding this interventional procedure. Comments:  No additional relevant information.  Plan of Care   Imaging Orders  No imaging studies ordered today    Procedure Orders     KNEE INJECTION     KNEE INJECTION  Medications ordered for procedure: Meds ordered this encounter  Medications  . lidocaine (PF) (XYLOCAINE) 1 % injection 5 mL  . ropivacaine (PF) 2 mg/mL (0.2%) (NAROPIN) injection 2 mL  . Sodium Hyaluronate SOSY 2 mL   Medications administered: We administered lidocaine (PF), ropivacaine (PF) 2  mg/mL (0.2%), and Sodium Hyaluronate.  See the medical record for exact dosing, route, and time of administration.  New Prescriptions   No medications on file   Disposition: Discharge home  Discharge Date & Time: 11/12/2017; 0950 hrs.   Physician-requested Follow-up: Return in about 2 weeks (around 11/26/2017) for Procedure (no sedation): (L) IA Hyalgan #4.  Future Appointments  Date Time Provider [place]  11/26/2017  9:00 AM Milinda Pointer, MD [facility] None   Primary Care Physician: Idelle Crouch, MD Location: [facility] Outpatient Pain Management Facility Note by: Gaspar Cola, MD Date: 11/12/2017; Time: 11:21 AM  Disclaimer:  Medicine is not an Chief Strategy Officer. The only guarantee in medicine is that nothing is guaranteed. It is important to note that the decision to proceed with this intervention was based on the information collected from the patient. The Data and conclusions were drawn from the patient's questionnaire, the interview, and the physical examination. Because the information was provided in large part by the patient, it cannot be guaranteed that it has not been purposely or unconsciously manipulated. Every effort has been made to obtain as much relevant data as possible for this evaluation. It is important to note that the conclusions that lead to this procedure are derived in large part from the available data. Always take into account that the treatment will also be dependent on availability of resources and existing treatment guidelines, considered by other Pain Management Practitioners as being common knowledge and practice, at the time of the intervention. For Medico-Legal purposes, it is also important to point out that variation in procedural techniques and pharmacological choices are the acceptable norm. The indications, contraindications, technique, and results of the above procedure should only be interpreted and judged by a Board-Certified Interventional  Pain Specialist with extensive familiarity and expertise in the same exact procedure and technique.

## 2017-11-12 ENCOUNTER — Ambulatory Visit: Payer: Medicare Other | Attending: Pain Medicine | Admitting: Pain Medicine

## 2017-11-12 ENCOUNTER — Other Ambulatory Visit: Payer: Self-pay

## 2017-11-12 VITALS — BP 140/75 | HR 94 | Temp 97.9°F | Resp 18 | Ht 67.0 in | Wt 221.0 lb

## 2017-11-12 DIAGNOSIS — Z885 Allergy status to narcotic agent status: Secondary | ICD-10-CM | POA: Diagnosis not present

## 2017-11-12 DIAGNOSIS — M25562 Pain in left knee: Secondary | ICD-10-CM | POA: Insufficient documentation

## 2017-11-12 DIAGNOSIS — M1712 Unilateral primary osteoarthritis, left knee: Secondary | ICD-10-CM | POA: Diagnosis not present

## 2017-11-12 DIAGNOSIS — Z9011 Acquired absence of right breast and nipple: Secondary | ICD-10-CM | POA: Diagnosis not present

## 2017-11-12 DIAGNOSIS — G8929 Other chronic pain: Secondary | ICD-10-CM | POA: Insufficient documentation

## 2017-11-12 MED ORDER — ROPIVACAINE HCL 2 MG/ML IJ SOLN
INTRAMUSCULAR | Status: AC
  Filled 2017-11-12: qty 10

## 2017-11-12 MED ORDER — LIDOCAINE HCL (PF) 1 % IJ SOLN
INTRAMUSCULAR | Status: AC
  Filled 2017-11-12: qty 5

## 2017-11-12 MED ORDER — SODIUM HYALURONATE (VISCOSUP) 20 MG/2ML IX SOSY
2.0000 mL | PREFILLED_SYRINGE | Freq: Once | INTRA_ARTICULAR | Status: AC
  Administered 2017-11-12: 2 mL via INTRA_ARTICULAR
  Filled 2017-11-12: qty 2

## 2017-11-12 MED ORDER — ROPIVACAINE HCL 2 MG/ML IJ SOLN
2.0000 mL | Freq: Once | INTRAMUSCULAR | Status: AC
  Administered 2017-11-12: 10 mL via INTRA_ARTICULAR

## 2017-11-12 MED ORDER — LIDOCAINE HCL (PF) 1 % IJ SOLN
5.0000 mL | Freq: Once | INTRAMUSCULAR | Status: AC
  Administered 2017-11-12: 5 mL

## 2017-11-12 NOTE — Patient Instructions (Signed)

## 2017-11-12 NOTE — Progress Notes (Signed)
Safety precautions to be maintained throughout the outpatient stay will include: orient to surroundings, keep bed in low position, maintain call bell within reach at all times, provide assistance with transfer out of bed and ambulation.  

## 2017-11-13 ENCOUNTER — Telehealth: Payer: Self-pay | Admitting: *Deleted

## 2017-11-13 NOTE — Telephone Encounter (Signed)
Attempted to call for post procedure follow-up. Message left. 

## 2017-11-26 ENCOUNTER — Other Ambulatory Visit: Payer: Self-pay

## 2017-11-26 ENCOUNTER — Encounter: Payer: Self-pay | Admitting: Pain Medicine

## 2017-11-26 ENCOUNTER — Ambulatory Visit: Payer: Medicare Other | Attending: Pain Medicine | Admitting: Pain Medicine

## 2017-11-26 VITALS — BP 142/79 | HR 79 | Temp 97.6°F | Resp 16 | Ht 67.5 in | Wt 221.0 lb

## 2017-11-26 DIAGNOSIS — G8929 Other chronic pain: Secondary | ICD-10-CM | POA: Diagnosis not present

## 2017-11-26 DIAGNOSIS — M25562 Pain in left knee: Secondary | ICD-10-CM | POA: Diagnosis not present

## 2017-11-26 DIAGNOSIS — M1712 Unilateral primary osteoarthritis, left knee: Secondary | ICD-10-CM | POA: Insufficient documentation

## 2017-11-26 DIAGNOSIS — Z9011 Acquired absence of right breast and nipple: Secondary | ICD-10-CM | POA: Insufficient documentation

## 2017-11-26 DIAGNOSIS — Z9889 Other specified postprocedural states: Secondary | ICD-10-CM | POA: Diagnosis not present

## 2017-11-26 DIAGNOSIS — Z885 Allergy status to narcotic agent status: Secondary | ICD-10-CM | POA: Insufficient documentation

## 2017-11-26 MED ORDER — SODIUM HYALURONATE (VISCOSUP) 20 MG/2ML IX SOSY
2.0000 mL | PREFILLED_SYRINGE | Freq: Once | INTRA_ARTICULAR | Status: AC
  Administered 2017-11-26: 2 mL via INTRA_ARTICULAR
  Filled 2017-11-26: qty 2

## 2017-11-26 MED ORDER — LIDOCAINE HCL (PF) 1 % IJ SOLN
4.0000 mL | Freq: Once | INTRAMUSCULAR | Status: AC
  Administered 2017-11-26: 4 mL
  Filled 2017-11-26: qty 5

## 2017-11-26 MED ORDER — ROPIVACAINE HCL 2 MG/ML IJ SOLN
4.0000 mL | Freq: Once | INTRAMUSCULAR | Status: AC
  Administered 2017-11-26: 4 mL via INTRA_ARTICULAR
  Filled 2017-11-26: qty 10

## 2017-11-26 NOTE — Progress Notes (Signed)
Patient's Name: Nancy Day  MRN: [id number]  Referring Provider: Idelle Crouch, MD  DOB: 1951/07/06  PCP: Idelle Crouch, MD  DOS: 11/26/2017  Note by: Gaspar Cola, MD  Service setting: Ambulatory outpatient  Specialty: Interventional Pain Management  Patient type: Established  Location: [facility] (AMB) Pain Management Facility  Visit type: Interventional Procedure   Primary Reason for Visit: Interventional Pain Management Treatment. CC: Knee Pain (left)  Procedure:  Anesthesia, Analgesia, Anxiolysis:  Type: Therapeutic Intra-Articular Hyalgan Knee Injection #4  Region: Medial infrapatellar Knee Region Level: Knee Joint Laterality: Left knee  Type: Local Anesthesia Local Anesthetic: Lidocaine 1% Route: Infiltration ([place]/IM) IV Access: Declined Sedation: Declined  Indication(s): Analgesia           Indications: 1. Osteoarthritis of knee (Left)   2. Chronic knee pain (Primary Area of Pain) (Left)    Pain Score: Pre-procedure: 3 /10 Post-procedure: 0-No pain(Numb)/10  Pre-op Assessment:  Nancy Day is a 67 y.o. (year old), female patient, seen today for interventional treatment. She  has a past surgical history that includes Abdominal hysterectomy; Shoulder surgery; Toe Surgery; Colonoscopy with propofol (N/A, 11/21/2016); Mastectomy (Right, 1998); Breast biopsy (Right, 1998); Reduction mammaplasty (Bilateral, 2012); and Augmentation mammaplasty (Right, 2012). Nancy Day has a current medication list which includes the following prescription(s): aspirin ec, biotin, bupropion, vitamin d3, cinnamon, coenzyme q-10, cyclobenzaprine, diltiazem, duloxetine, garlic, lidocaine, losartan-hydrochlorothiazide, niacin, potassium chloride, rosuvastatin, turmeric, and zolpidem. Her primarily concern today is the Knee Pain (left)  Initial Vital Signs:  Pulse Rate: 79 Temp: 97.6 F (36.4 C) Resp: 16 BP: (!) 146/81 SpO2: 98 %  BMI: Estimated body mass index is 34.1 kg/m as calculated from  the following:   Height as of this encounter: 5' 7.5" (1.715 m).   Weight as of this encounter: 221 lb (100.2 kg).  Risk Assessment: Allergies: Reviewed. She is allergic to hydrocodone and percocet [oxycodone-acetaminophen].  Allergy Precautions: None required Coagulopathies: Reviewed. None identified.  Blood-thinner therapy: None at this time Active Infection(s): Reviewed. None identified. Nancy Day is afebrile  Site Confirmation: Nancy Day was asked to confirm the procedure and laterality before marking the site Procedure checklist: Completed Consent: Before the procedure and under the influence of no sedative(s), amnesic(s), or anxiolytics, the patient was informed of the treatment options, risks and possible complications. To fulfill our ethical and legal obligations, as recommended by the American Medical Association's Code of Ethics, I have informed the patient of my clinical impression; the nature and purpose of the treatment or procedure; the risks, benefits, and possible complications of the intervention; the alternatives, including doing nothing; the risk(s) and benefit(s) of the alternative treatment(s) or procedure(s); and the risk(s) and benefit(s) of doing nothing. The patient was provided information about the general risks and possible complications associated with the procedure. These may include, but are not limited to: failure to achieve desired goals, infection, bleeding, organ or nerve damage, allergic reactions, paralysis, and death. In addition, the patient was informed of those risks and complications associated to the procedure, such as failure to decrease pain; infection; bleeding; organ or nerve damage with subsequent damage to sensory, motor, and/or autonomic systems, resulting in permanent pain, numbness, and/or weakness of one or several areas of the body; allergic reactions; (i.e.: anaphylactic reaction); and/or death. Furthermore, the patient was informed of those  risks and complications associated with the medications. These include, but are not limited to: allergic reactions (i.e.: anaphylactic or anaphylactoid reaction(s)); adrenal axis suppression; blood sugar elevation that in diabetics may  result in ketoacidosis or comma; water retention that in patients with history of congestive heart failure may result in shortness of breath, pulmonary edema, and decompensation with resultant heart failure; weight gain; swelling or edema; medication-induced neural toxicity; particulate matter embolism and blood vessel occlusion with resultant organ, and/or nervous system infarction; and/or aseptic necrosis of one or more joints. Finally, the patient was informed that Medicine is not an exact science; therefore, there is also the possibility of unforeseen or unpredictable risks and/or possible complications that may result in a catastrophic outcome. The patient indicated having understood very clearly. We have given the patient no guarantees and we have made no promises. Enough time was given to the patient to ask questions, all of which were answered to the patient's satisfaction. Nancy Day has indicated that she wanted to continue with the procedure. Attestation: I, the ordering provider, attest that I have discussed with the patient the benefits, risks, side-effects, alternatives, likelihood of achieving goals, and potential problems during recovery for the procedure that I have provided informed consent. Date  Time: 11/26/2017  8:55 AM  Pre-Procedure Preparation:  Monitoring: As per clinic protocol. Respiration, ETCO2, SpO2, BP, heart rate and rhythm monitor placed and checked for adequate function Safety Precautions: Patient was assessed for positional comfort and pressure points before starting the procedure. Time-out: I initiated and conducted the "Time-out" before starting the procedure, as per protocol. The patient was asked to participate by confirming the accuracy  of the "Time Out" information. Verification of the correct person, site, and procedure were performed and confirmed by me, the nursing staff, and the patient. "Time-out" conducted as per Joint Commission's Universal Protocol (UP.01.01.01). "Time-out" Date & Time: 11/26/2017; 0927 hrs.  Description of Procedure Process:   Position: Sitting Target Area: Knee Joint Approach: Just above the Medial tibial plateau, lateral to the infrapatellar tendon. Area Prepped: Entire knee area, from the mid-thigh to the mid-shin. Prepping solution: ChloraPrep (2% chlorhexidine gluconate and 70% isopropyl alcohol) Safety Precautions: Aspiration looking for blood return was conducted prior to all injections. At no point did we inject any substances, as a needle was being advanced. No attempts were made at seeking any paresthesias. Safe injection practices and needle disposal techniques used. Medications properly checked for expiration dates. SDV (single dose vial) medications used. Description of the Procedure: Protocol guidelines were followed. The patient was placed in position over the fluoroscopy table. The target area was identified and the area prepped in the usual manner. Skin desensitized using vapocoolant spray. Skin & deeper tissues infiltrated with local anesthetic. Appropriate amount of time allowed to pass for local anesthetics to take effect. The procedure needles were then advanced to the target area. Proper needle placement secured. Negative aspiration confirmed. Solution injected in intermittent fashion, asking for systemic symptoms every 0.5cc of injectate. The needles were then removed and the area cleansed, making sure to leave some of the prepping solution back to take advantage of its long term bactericidal properties. Vitals:   11/26/17 0907 11/26/17 0932  BP: (!) 146/81 (!) 142/79  Pulse: 79 79  Resp: 16 16  Temp: 97.6 F (36.4 C)   SpO2: 98% 100%  Weight: 221 lb (100.2 kg)   Height: 5' 7.5"  (1.715 m)     Start Time: 0927 hrs. End Time: 0931 hrs. Materials:  Needle(s) Type: Regular needle Gauge: 25G Length: 1.5-in Medication(s): Please see orders for medications and dosing details.  Imaging Guidance:  Type of Imaging Technique: None used Indication(s): N/A Exposure Time: No  patient exposure Contrast: None used. Fluoroscopic Guidance: N/A Ultrasound Guidance: N/A Interpretation: N/A  Antibiotic Prophylaxis:   Antibiotics Given (last 72 hours)    None     Indication(s): None identified  Post-operative Assessment:  Post-procedure Vital Signs:  Pulse Rate: 79 Temp: 97.6 F (36.4 C) Resp: 16 BP: (!) 142/79 SpO2: 100 %  EBL: None  Complications: No immediate post-treatment complications observed by team, or reported by patient.  Note: The patient tolerated the entire procedure well. A repeat set of vitals were taken after the procedure and the patient was kept under observation following institutional policy, for this type of procedure. Post-procedural neurological assessment was performed, showing return to baseline, prior to discharge. The patient was provided with post-procedure discharge instructions, including a section on how to identify potential problems. Should any problems arise concerning this procedure, the patient was given instructions to immediately contact us, at any time, without hesitation. In any case, we plan to contact the patient by telephone for a follow-up status report regarding this interventional procedure.  Comments:  No additional relevant information.  Plan of Care   Imaging Orders  No imaging studies ordered today    Procedure Orders     KNEE INJECTION     KNEE INJECTION  Medications ordered for procedure: Meds ordered this encounter  Medications  . lidocaine (PF) (XYLOCAINE) 1 % injection 4 mL  . ropivacaine (PF) 2 mg/mL (0.2%) (NAROPIN) injection 4 mL  . Sodium Hyaluronate SOSY 2 mL   Medications administered: We  administered lidocaine (PF), ropivacaine (PF) 2 mg/mL (0.2%), and Sodium Hyaluronate.  See the medical record for exact dosing, route, and time of administration.  New Prescriptions   No medications on file   Disposition: Discharge home  Discharge Date & Time: 11/26/2017; 0936 hrs.   Physician-requested Follow-up: Return in about 2 weeks (around 12/10/2017) for Procedure (no sedation): (L) IA Hyalgan #5.  Future Appointments  Date Time Provider [place]  12/10/2017 12:30 PM Milinda Pointer, MD [facility] None   Primary Care Physician: Idelle Crouch, MD Location: [facility] Outpatient Pain Management Facility Note by: Gaspar Cola, MD Date: 11/26/2017; Time: 10:42 AM  Disclaimer:  Medicine is not an Chief Strategy Officer. The only guarantee in medicine is that nothing is guaranteed. It is important to note that the decision to proceed with this intervention was based on the information collected from the patient. The Data and conclusions were drawn from the patient's questionnaire, the interview, and the physical examination. Because the information was provided in large part by the patient, it cannot be guaranteed that it has not been purposely or unconsciously manipulated. Every effort has been made to obtain as much relevant data as possible for this evaluation. It is important to note that the conclusions that lead to this procedure are derived in large part from the available data. Always take into account that the treatment will also be dependent on availability of resources and existing treatment guidelines, considered by other Pain Management Practitioners as being common knowledge and practice, at the time of the intervention. For Medico-Legal purposes, it is also important to point out that variation in procedural techniques and pharmacological choices are the acceptable norm. The indications, contraindications, technique, and results of the above procedure should only be interpreted  and judged by a Board-Certified Interventional Pain Specialist with extensive familiarity and expertise in the same exact procedure and technique.

## 2017-11-26 NOTE — Progress Notes (Signed)
Safety precautions to be maintained throughout the outpatient stay will include: orient to surroundings, keep bed in low position, maintain call bell within reach at all times, provide assistance with transfer out of bed and ambulation.  

## 2017-11-26 NOTE — Patient Instructions (Signed)

## 2017-11-27 ENCOUNTER — Telehealth: Payer: Self-pay

## 2017-11-27 NOTE — Telephone Encounter (Signed)
Post procedure phone call.  Patient states she is doing well.  

## 2017-12-10 ENCOUNTER — Telehealth: Payer: Self-pay

## 2017-12-10 ENCOUNTER — Ambulatory Visit: Payer: Self-pay | Admitting: Pain Medicine

## 2017-12-10 NOTE — Progress Notes (Deleted)
Patient's Name: Nancy Day  MRN: [id number]  Referring Provider: Idelle Crouch, MD  DOB: 1951/05/06  PCP: Idelle Crouch, MD  DOS: 12/10/2017  Note by: Gaspar Cola, MD  Service setting: Ambulatory outpatient  Specialty: Interventional Pain Management  Patient type: Established  Location: [facility] (AMB) Pain Management Facility  Visit type: Interventional Procedure   Primary Reason for Visit: Interventional Pain Management Treatment. CC: No chief complaint on file.  Procedure:  Anesthesia, Analgesia, Anxiolysis:  Type: Therapeutic Intra-Articular Hyalgan Knee Injection #5  Region: Lateral infrapatellar Knee Region Level: Knee Joint Laterality: Left knee  Type: Local Anesthesia Local Anesthetic: Lidocaine 1% Route: Infiltration ([place]/IM) IV Access: Declined Sedation: Declined  Indication(s): Analgesia           Indications: 1. Osteoarthritis of knee (Left)   2. Tricompartment osteoarthritis of knee (Left)   3. Chronic knee pain (Primary Area of Pain) (Left)    Pain Score: Pre-procedure:  /10 Post-procedure:  /10  Pre-op Assessment:  Nancy Day is a 67 y.o. (year old), female patient, seen today for interventional treatment. She  has a past surgical history that includes Abdominal hysterectomy; Shoulder surgery; Toe Surgery; Colonoscopy with propofol (N/A, 11/21/2016); Mastectomy (Right, 1998); Breast biopsy (Right, 1998); Reduction mammaplasty (Bilateral, 2012); and Augmentation mammaplasty (Right, 2012). Nancy Day has a current medication list which includes the following prescription(s): aspirin ec, biotin, bupropion, vitamin d3, cinnamon, coenzyme q-10, cyclobenzaprine, diltiazem, duloxetine, garlic, lidocaine, losartan-hydrochlorothiazide, niacin, potassium chloride, rosuvastatin, turmeric, and zolpidem. Her primarily concern today is the No chief complaint on file.  Initial Vital Signs:  Pulse Rate:   Temp:   Resp:   BP:   SpO2:    BMI: Estimated body mass index is  34.1 kg/m as calculated from the following:   Height as of 11/26/17: 5' 7.5" (1.715 m).   Weight as of 11/26/17: 221 lb (100.2 kg).  Risk Assessment: Allergies: Reviewed. She is allergic to hydrocodone and percocet [oxycodone-acetaminophen].  Allergy Precautions: None required Coagulopathies: Reviewed. None identified.  Blood-thinner therapy: None at this time Active Infection(s): Reviewed. None identified. Nancy Day is afebrile  Site Confirmation: Nancy Day was asked to confirm the procedure and laterality before marking the site Procedure checklist: Completed Consent: Before the procedure and under the influence of no sedative(s), amnesic(s), or anxiolytics, the patient was informed of the treatment options, risks and possible complications. To fulfill our ethical and legal obligations, as recommended by the American Medical Association's Code of Ethics, I have informed the patient of my clinical impression; the nature and purpose of the treatment or procedure; the risks, benefits, and possible complications of the intervention; the alternatives, including doing nothing; the risk(s) and benefit(s) of the alternative treatment(s) or procedure(s); and the risk(s) and benefit(s) of doing nothing. The patient was provided information about the general risks and possible complications associated with the procedure. These may include, but are not limited to: failure to achieve desired goals, infection, bleeding, organ or nerve damage, allergic reactions, paralysis, and death. In addition, the patient was informed of those risks and complications associated to the procedure, such as failure to decrease pain; infection; bleeding; organ or nerve damage with subsequent damage to sensory, motor, and/or autonomic systems, resulting in permanent pain, numbness, and/or weakness of one or several areas of the body; allergic reactions; (i.e.: anaphylactic reaction); and/or death. Furthermore, the patient was  informed of those risks and complications associated with the medications. These include, but are not limited to: allergic reactions (i.e.: anaphylactic or anaphylactoid reaction(s));  adrenal axis suppression; blood sugar elevation that in diabetics may result in ketoacidosis or comma; water retention that in patients with history of congestive heart failure may result in shortness of breath, pulmonary edema, and decompensation with resultant heart failure; weight gain; swelling or edema; medication-induced neural toxicity; particulate matter embolism and blood vessel occlusion with resultant organ, and/or nervous system infarction; and/or aseptic necrosis of one or more joints. Finally, the patient was informed that Medicine is not an exact science; therefore, there is also the possibility of unforeseen or unpredictable risks and/or possible complications that may result in a catastrophic outcome. The patient indicated having understood very clearly. We have given the patient no guarantees and we have made no promises. Enough time was given to the patient to ask questions, all of which were answered to the patient's satisfaction. Nancy Day has indicated that she wanted to continue with the procedure. Attestation: I, the ordering provider, attest that I have discussed with the patient the benefits, risks, side-effects, alternatives, likelihood of achieving goals, and potential problems during recovery for the procedure that I have provided informed consent. Date  Time: {CHL [facility]-PAIN TIME CHOICES:[id number]}  Pre-Procedure Preparation:  Monitoring: As per clinic protocol. Respiration, ETCO2, SpO2, BP, heart rate and rhythm monitor placed and checked for adequate function Safety Precautions: Patient was assessed for positional comfort and pressure points before starting the procedure. Time-out: I initiated and conducted the "Time-out" before starting the procedure, as per protocol. The patient was asked to  participate by confirming the accuracy of the "Time Out" information. Verification of the correct person, site, and procedure were performed and confirmed by me, the nursing staff, and the patient. "Time-out" conducted as per Joint Commission's Universal Protocol (UP.01.01.01). Time:    Description of Procedure:       Position: Sitting Target Area: Knee Joint Approach: Just above the Lateral tibial plateau, lateral to the infrapatellar tendon. Area Prepped: Entire knee area, from the mid-thigh to the mid-shin. Prepping solution: ChloraPrep (2% chlorhexidine gluconate and 70% isopropyl alcohol) Safety Precautions: Aspiration looking for blood return was conducted prior to all injections. At no point did we inject any substances, as a needle was being advanced. No attempts were made at seeking any paresthesias. Safe injection practices and needle disposal techniques used. Medications properly checked for expiration dates. SDV (single dose vial) medications used. Description of the Procedure: Protocol guidelines were followed. The patient was placed in position over the fluoroscopy table. The target area was identified and the area prepped in the usual manner. Skin desensitized using vapocoolant spray. Skin & deeper tissues infiltrated with local anesthetic. Appropriate amount of time allowed to pass for local anesthetics to take effect. The procedure needles were then advanced to the target area. Proper needle placement secured. Negative aspiration confirmed. Solution injected in intermittent fashion, asking for systemic symptoms every 0.5cc of injectate. The needles were then removed and the area cleansed, making sure to leave some of the prepping solution back to take advantage of its long term bactericidal properties. There were no vitals filed for this visit.  Start Time:   hrs. End Time:   hrs. Materials:  Needle(s) Type: Regular needle Gauge: 22G Length: 3.5-in Medication(s): Please see orders  for medications and dosing details.  Imaging Guidance:  Type of Imaging Technique: None used Indication(s): N/A Exposure Time: No patient exposure Contrast: None used. Fluoroscopic Guidance: N/A Ultrasound Guidance: N/A Interpretation: N/A  Antibiotic Prophylaxis:   Anti-infectives (From admission, onward)   None  Indication(s): None identified  Post-operative Assessment:  Post-procedure Vital Signs:  Pulse Rate:   Temp:   Resp:   BP:   SpO2:    EBL: None  Complications: No immediate post-treatment complications observed by team, or reported by patient.  Note: The patient tolerated the entire procedure well. A repeat set of vitals were taken after the procedure and the patient was kept under observation following institutional policy, for this type of procedure. Post-procedural neurological assessment was performed, showing return to baseline, prior to discharge. The patient was provided with post-procedure discharge instructions, including a section on how to identify potential problems. Should any problems arise concerning this procedure, the patient was given instructions to immediately contact us, at any time, without hesitation. In any case, we plan to contact the patient by telephone for a follow-up status report regarding this interventional procedure.  Comments:  No additional relevant information.  Plan of Care   Imaging Orders  No imaging studies ordered today   Procedure Orders    No procedure(s) ordered today    Medications ordered for procedure: No orders of the defined types were placed in this encounter.  Medications administered: Marlene Lard had no medications administered during this visit.  See the medical record for exact dosing, route, and time of administration.  New Prescriptions   No medications on file   Disposition: Discharge home  Discharge Date & Time: 12/10/2017;   hrs.   Physician-requested Follow-up: No Follow-up on  file.  Future Appointments  Date Time Provider [place]  12/10/2017 12:30 PM Milinda Pointer, MD [facility] None   Primary Care Physician: Idelle Crouch, MD Location: [facility] Outpatient Pain Management Facility Note by: Gaspar Cola, MD Date: 12/10/2017; Time: 7:39 AM  Disclaimer:  Medicine is not an Chief Strategy Officer. The only guarantee in medicine is that nothing is guaranteed. It is important to note that the decision to proceed with this intervention was based on the information collected from the patient. The Data and conclusions were drawn from the patient's questionnaire, the interview, and the physical examination. Because the information was provided in large part by the patient, it cannot be guaranteed that it has not been purposely or unconsciously manipulated. Every effort has been made to obtain as much relevant data as possible for this evaluation. It is important to note that the conclusions that lead to this procedure are derived in large part from the available data. Always take into account that the treatment will also be dependent on availability of resources and existing treatment guidelines, considered by other Pain Management Practitioners as being common knowledge and practice, at the time of the intervention. For Medico-Legal purposes, it is also important to point out that variation in procedural techniques and pharmacological choices are the acceptable norm. The indications, contraindications, technique, and results of the above procedure should only be interpreted and judged by a Board-Certified Interventional Pain Specialist with extensive familiarity and expertise in the same exact procedure and technique.

## 2017-12-17 ENCOUNTER — Ambulatory Visit: Admitting: Pain Medicine

## 2017-12-17 NOTE — Progress Notes (Deleted)
Patient's Name: Nancy Day  MRN: [id number]  Referring Provider: Idelle Crouch, MD  DOB: [birth year]  PCP: Idelle Crouch, MD  DOS: 12/17/2017  Note by: Gaspar Cola, MD  Service setting: Ambulatory outpatient  Specialty: Interventional Pain Management  Patient type: Established  Location: [facility] (AMB) Pain Management Facility  Visit type: Interventional Procedure   Primary Reason for Visit: Interventional Pain Management Treatment. CC: No chief complaint on file.  Procedure:  Anesthesia, Analgesia, Anxiolysis:  Type: Therapeutic Intra-Articular Hyalgan Knee Injection #5  Region: Lateral infrapatellar Knee Region Level: Knee Joint Laterality: Left knee  Type: Local Anesthesia Local Anesthetic: Lidocaine 1% Route: Infiltration (/IM) IV Access: Declined Sedation: Declined  Indication(s): Analgesia           Indications: 1. Osteoarthritis of knee (Left)   2. Tricompartment osteoarthritis of knee (Left)   3. Chronic knee pain (Primary Area of Pain) (Left)    Pain Score: Pre-procedure:  /10 Post-procedure:  /10  Pre-op Assessment:  Nancy Day is a 67 y.o. (year old), female patient, seen today for interventional treatment. She  has a past surgical history that includes Abdominal hysterectomy; Shoulder surgery; Toe Surgery; Colonoscopy with propofol (N/A, 11/21/2016); Mastectomy (Right, 1998); Breast biopsy (Right, 1998); Reduction mammaplasty (Bilateral, 2012); and Augmentation mammaplasty (Right, 2012). [name] has a current medication list which includes the following prescription(s): aspirin ec, biotin, bupropion, vitamin d3, cinnamon, coenzyme q-10, cyclobenzaprine, diltiazem, duloxetine, garlic, lidocaine, losartan-hydrochlorothiazide, niacin, potassium chloride, rosuvastatin, turmeric, and zolpidem. Her primarily concern today is the No chief complaint on file.  Initial Vital Signs:  Pulse Rate:   Temp:   Resp:   BP:   SpO2:    BMI: Estimated body mass index is  34.1 kg/m as calculated from the following:   Height as of 11/26/17: 5' 7.5" (1.715 m).   Weight as of 11/26/17: 221 lb (100.2 kg).  Risk Assessment: Allergies: Reviewed. She is allergic to hydrocodone and percocet [oxycodone-acetaminophen].  Allergy Precautions: None required Coagulopathies: Reviewed. None identified.  Blood-thinner therapy: None at this time Active Infection(s): Reviewed. None identified. Nancy Day is afebrile  Site Confirmation: Nancy Day was asked to confirm the procedure and laterality before marking the site Procedure checklist: Completed Consent: Before the procedure and under the influence of no sedative(s), amnesic(s), or anxiolytics, the patient was informed of the treatment options, risks and possible complications. To fulfill our ethical and legal obligations, as recommended by the American Medical Association's Code of Ethics, I have informed the patient of my clinical impression; the nature and purpose of the treatment or procedure; the risks, benefits, and possible complications of the intervention; the alternatives, including doing nothing; the risk(s) and benefit(s) of the alternative treatment(s) or procedure(s); and the risk(s) and benefit(s) of doing nothing. The patient was provided information about the general risks and possible complications associated with the procedure. These may include, but are not limited to: failure to achieve desired goals, infection, bleeding, organ or nerve damage, allergic reactions, paralysis, and death. In addition, the patient was informed of those risks and complications associated to the procedure, such as failure to decrease pain; infection; bleeding; organ or nerve damage with subsequent damage to sensory, motor, and/or autonomic systems, resulting in permanent pain, numbness, and/or weakness of one or several areas of the body; allergic reactions; (i.e.: anaphylactic reaction); and/or death. Furthermore, the patient was  informed of those risks and complications associated with the medications. These include, but are not limited to: allergic reactions (i.e.: anaphylactic or anaphylactoid reaction(s));  adrenal axis suppression; blood sugar elevation that in diabetics may result in ketoacidosis or comma; water retention that in patients with history of congestive heart failure may result in shortness of breath, pulmonary edema, and decompensation with resultant heart failure; weight gain; swelling or edema; medication-induced neural toxicity; particulate matter embolism and blood vessel occlusion with resultant organ, and/or nervous system infarction; and/or aseptic necrosis of one or more joints. Finally, the patient was informed that Medicine is not an exact science; therefore, there is also the possibility of unforeseen or unpredictable risks and/or possible complications that may result in a catastrophic outcome. The patient indicated having understood very clearly. We have given the patient no guarantees and we have made no promises. Enough time was given to the patient to ask questions, all of which were answered to the patient's satisfaction. Nancy Day has indicated that she wanted to continue with the procedure. Attestation: I, the ordering provider, attest that I have discussed with the patient the benefits, risks, side-effects, alternatives, likelihood of achieving goals, and potential problems during recovery for the procedure that I have provided informed consent. Date  Time: {CHL [facility]-PAIN TIME CHOICES:[id number]}  Pre-Procedure Preparation:  Monitoring: As per clinic protocol. Respiration, ETCO2, SpO2, BP, heart rate and rhythm monitor placed and checked for adequate function Safety Precautions: Patient was assessed for positional comfort and pressure points before starting the procedure. Time-out: I initiated and conducted the "Time-out" before starting the procedure, as per protocol. The patient was asked to  participate by confirming the accuracy of the "Time Out" information. Verification of the correct person, site, and procedure were performed and confirmed by me, the nursing staff, and the patient. "Time-out" conducted as per Joint Commission's Universal Protocol (UP.01.01.01). Time:    Description of Procedure:       Position: Sitting Target Area: Knee Joint Approach: Just above the Lateral tibial plateau, lateral to the infrapatellar tendon. Area Prepped: Entire knee area, from the mid-thigh to the mid-shin. Prepping solution: ChloraPrep (2% chlorhexidine gluconate and 70% isopropyl alcohol) Safety Precautions: Aspiration looking for blood return was conducted prior to all injections. At no point did we inject any substances, as a needle was being advanced. No attempts were made at seeking any paresthesias. Safe injection practices and needle disposal techniques used. Medications properly checked for expiration dates. SDV (single dose vial) medications used. Description of the Procedure: Protocol guidelines were followed. The patient was placed in position over the fluoroscopy table. The target area was identified and the area prepped in the usual manner. Skin desensitized using vapocoolant spray. Skin & deeper tissues infiltrated with local anesthetic. Appropriate amount of time allowed to pass for local anesthetics to take effect. The procedure needles were then advanced to the target area. Proper needle placement secured. Negative aspiration confirmed. Solution injected in intermittent fashion, asking for systemic symptoms every 0.5cc of injectate. The needles were then removed and the area cleansed, making sure to leave some of the prepping solution back to take advantage of its long term bactericidal properties. There were no vitals filed for this visit.  Start Time:   hrs. End Time:   hrs. Materials:  Needle(s) Type: Regular needle Gauge: 22G Length: 3.5-in Medication(s): Please see orders  for medications and dosing details.  Imaging Guidance:  Type of Imaging Technique: None used Indication(s): N/A Exposure Time: No patient exposure Contrast: None used. Fluoroscopic Guidance: N/A Ultrasound Guidance: N/A Interpretation: N/A  Antibiotic Prophylaxis:   Anti-infectives (From admission, onward)   None  Indication(s): None identified  Post-operative Assessment:  Post-procedure Vital Signs:  Pulse Rate:   Temp:   Resp:   BP:   SpO2:    EBL: None  Complications: No immediate post-treatment complications observed by team, or reported by patient.  Note: The patient tolerated the entire procedure well. A repeat set of vitals were taken after the procedure and the patient was kept under observation following institutional policy, for this type of procedure. Post-procedural neurological assessment was performed, showing return to baseline, prior to discharge. The patient was provided with post-procedure discharge instructions, including a section on how to identify potential problems. Should any problems arise concerning this procedure, the patient was given instructions to immediately contact us, at any time, without hesitation. In any case, we plan to contact the patient by telephone for a follow-up status report regarding this interventional procedure.  Comments:  No additional relevant information.  Plan of Care   Imaging Orders  No imaging studies ordered today   Procedure Orders    No procedure(s) ordered today    Medications ordered for procedure: No orders of the defined types were placed in this encounter.  Medications administered: Marlene Lard had no medications administered during this visit.  See the medical record for exact dosing, route, and time of administration.  New Prescriptions   No medications on file   Disposition: Discharge home  Discharge Date & Time: 12/17/2017;   hrs.   Physician-requested Follow-up: No Follow-up on  file.  Future Appointments  Date Time Provider [place]  12/17/2017  9:30 AM Milinda Pointer, MD [facility] None   Primary Care Physician: Idelle Crouch, MD Location: [facility] Outpatient Pain Management Facility Note by: Gaspar Cola, MD Date: 12/17/2017; Time: 6:10 AM  Disclaimer:  Medicine is not an Chief Strategy Officer. The only guarantee in medicine is that nothing is guaranteed. It is important to note that the decision to proceed with this intervention was based on the information collected from the patient. The Data and conclusions were drawn from the patient's questionnaire, the interview, and the physical examination. Because the information was provided in large part by the patient, it cannot be guaranteed that it has not been purposely or unconsciously manipulated. Every effort has been made to obtain as much relevant data as possible for this evaluation. It is important to note that the conclusions that lead to this procedure are derived in large part from the available data. Always take into account that the treatment will also be dependent on availability of resources and existing treatment guidelines, considered by other Pain Management Practitioners as being common knowledge and practice, at the time of the intervention. For Medico-Legal purposes, it is also important to point out that variation in procedural techniques and pharmacological choices are the acceptable norm. The indications, contraindications, technique, and results of the above procedure should only be interpreted and judged by a Board-Certified Interventional Pain Specialist with extensive familiarity and expertise in the same exact procedure and technique.

## 2018-08-19 ENCOUNTER — Other Ambulatory Visit: Payer: Self-pay | Admitting: Internal Medicine

## 2018-08-19 DIAGNOSIS — Z1231 Encounter for screening mammogram for malignant neoplasm of breast: Secondary | ICD-10-CM

## 2018-09-21 ENCOUNTER — Ambulatory Visit: Payer: Medicare Other

## 2018-10-19 ENCOUNTER — Ambulatory Visit
Admission: RE | Admit: 2018-10-19 | Discharge: 2018-10-19 | Disposition: A | Discharge status: Home / Self Care | Payer: Medicare Other | Source: Ambulatory Visit | Attending: Internal Medicine | Admitting: Internal Medicine

## 2018-10-19 DIAGNOSIS — Z1231 Encounter for screening mammogram for malignant neoplasm of breast: Secondary | ICD-10-CM | POA: Diagnosis not present

## 2018-11-12 ENCOUNTER — Other Ambulatory Visit: Payer: Self-pay | Admitting: Internal Medicine

## 2018-11-12 DIAGNOSIS — R194 Change in bowel habit: Secondary | ICD-10-CM

## 2018-11-18 ENCOUNTER — Other Ambulatory Visit: Payer: Self-pay | Admitting: Internal Medicine

## 2018-11-18 DIAGNOSIS — R194 Change in bowel habit: Secondary | ICD-10-CM

## 2018-11-18 DIAGNOSIS — R1084 Generalized abdominal pain: Secondary | ICD-10-CM

## 2018-11-30 ENCOUNTER — Ambulatory Visit
Admission: RE | Admit: 2018-11-30 | Discharge: 2018-11-30 | Disposition: A | Discharge status: Home / Self Care | Payer: Medicare Other | Source: Ambulatory Visit | Attending: Internal Medicine | Admitting: Internal Medicine

## 2018-11-30 DIAGNOSIS — R194 Change in bowel habit: Secondary | ICD-10-CM | POA: Diagnosis present

## 2018-11-30 DIAGNOSIS — R1084 Generalized abdominal pain: Secondary | ICD-10-CM | POA: Insufficient documentation

## 2018-11-30 HISTORY — DX: Malignant neoplasm of unspecified site of right female breast: C50.911

## 2018-11-30 MED ORDER — IOPAMIDOL (ISOVUE-300) INJECTION 61%
100.0000 mL | Freq: Once | INTRAVENOUS | Status: AC | PRN
  Administered 2018-11-30: 100 mL via INTRAVENOUS

## 2019-01-11 IMAGING — CR DG SHOULDER 2+V*L*
1 series · 3 of 3 positions shown · non-contrast
Comparison: CT 12/31/2016.

CLINICAL DATA: Chronic shoulder pain.

EXAM:
LEFT SHOULDER - 2+ VIEW

[Series 1: dg shoulder left · 0.14mm/px · 3 of 3 slices shown]
[im 1/3]
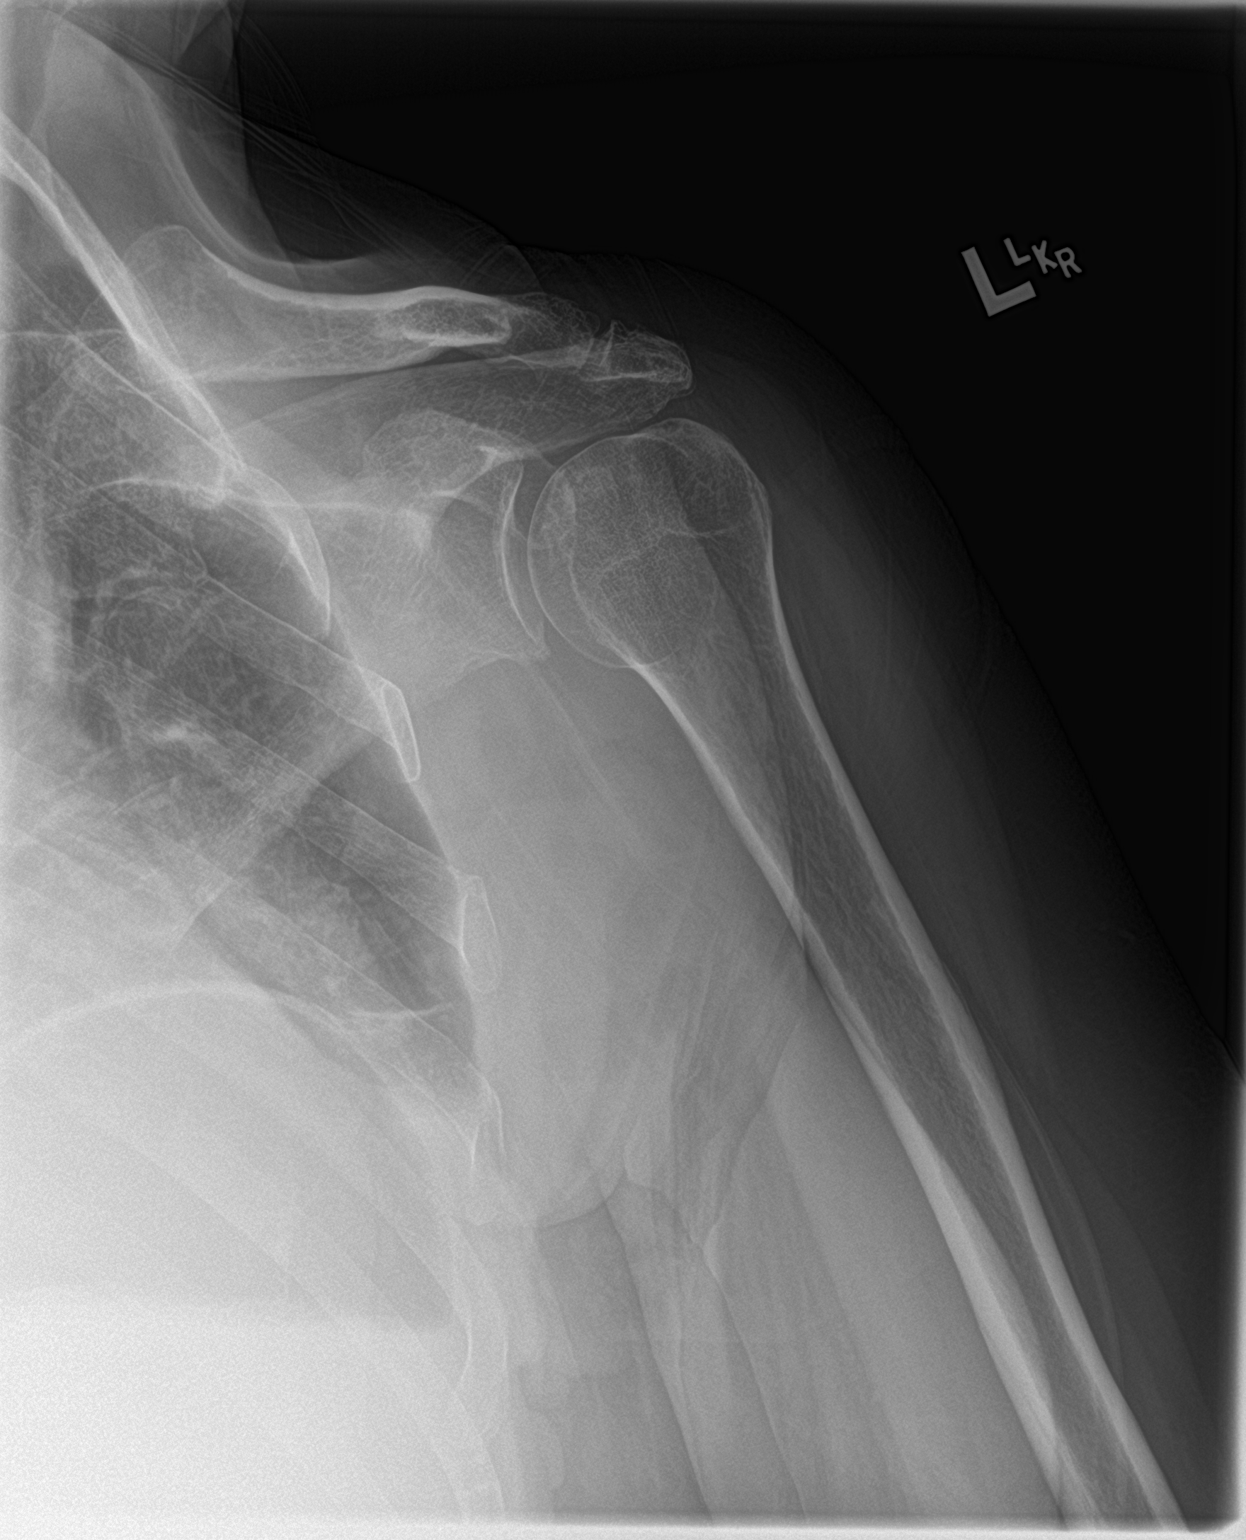
[im 2/3]
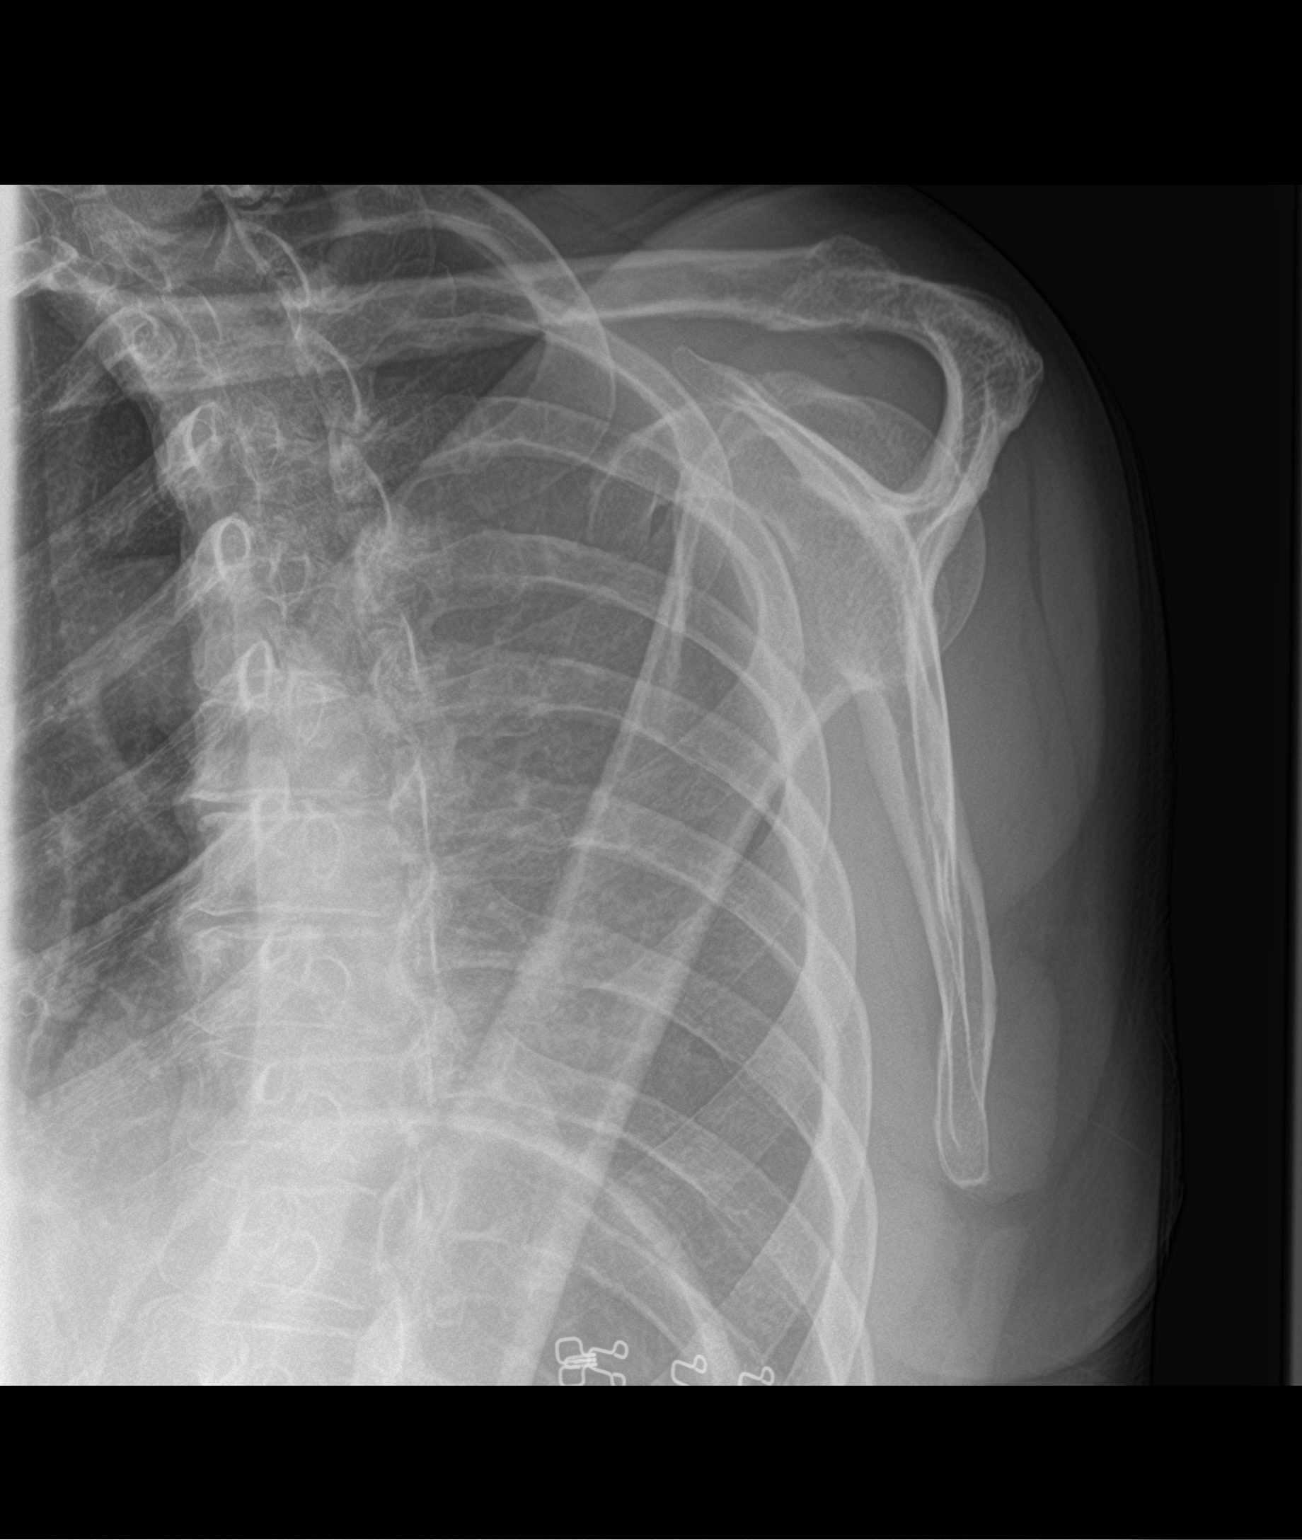
[im 3/3]
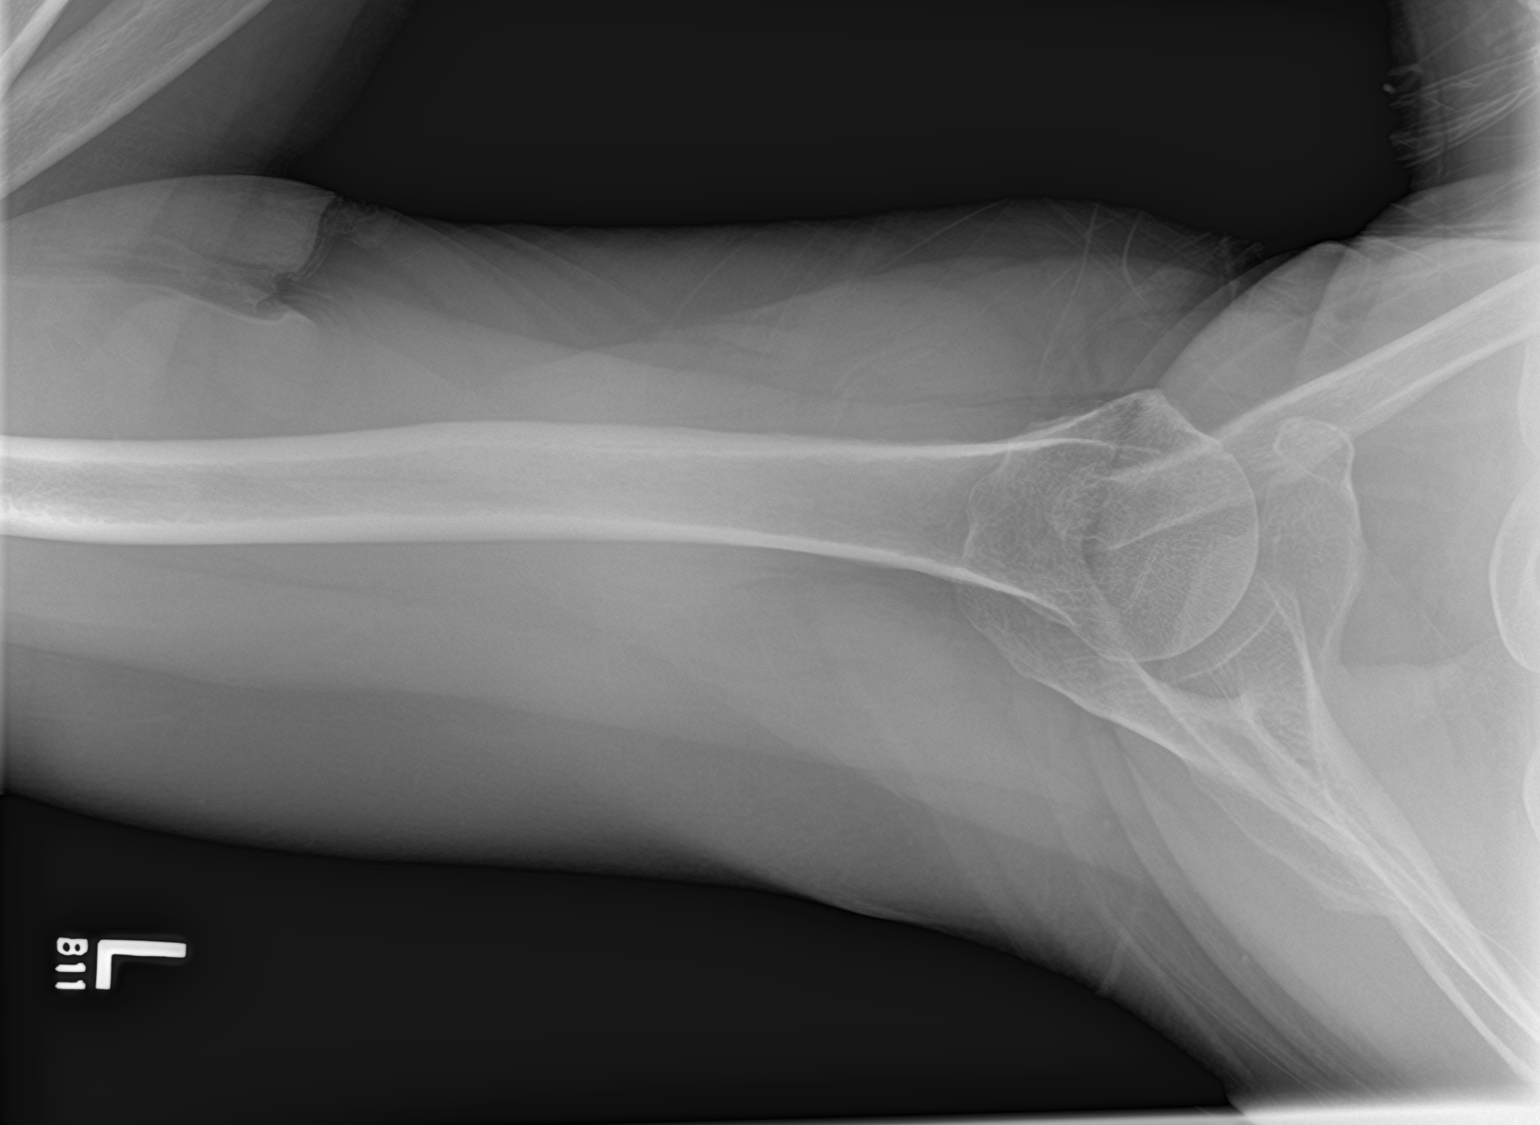

[3 of 3 positions shown; findings below may reference images not displayed]

FINDINGS: Diffuse osteopenia and degenerative change. No acute bony or joint
abnormality identified. No evidence of fracture or dislocation.
Prominent air-fluid level left upper quadrant, possibly from gastric
distention. Abdominal series suggested for further evaluation .
IMPRESSION: 1. Diffuse osteopenia degenerative change. No acute bony abnormality
identified.

2. Prominent air-fluid collection left upper quadrant, possibly from
gastric distention. Abdominal series suggested for further
evaluation.

## 2019-01-11 IMAGING — CR DG SHOULDER 2+V*R*
1 series · 3 of 3 positions shown · non-contrast
Comparison: None in PACs

CLINICAL DATA: Chronic bilateral shoulder pain. Previous rotator
cuff surgery on the right.

EXAM:
RIGHT SHOULDER - 2+ VIEW

[Series 1: dg shoulder right · 0.14mm/px · 3 of 3 slices shown]
[im 1/3]
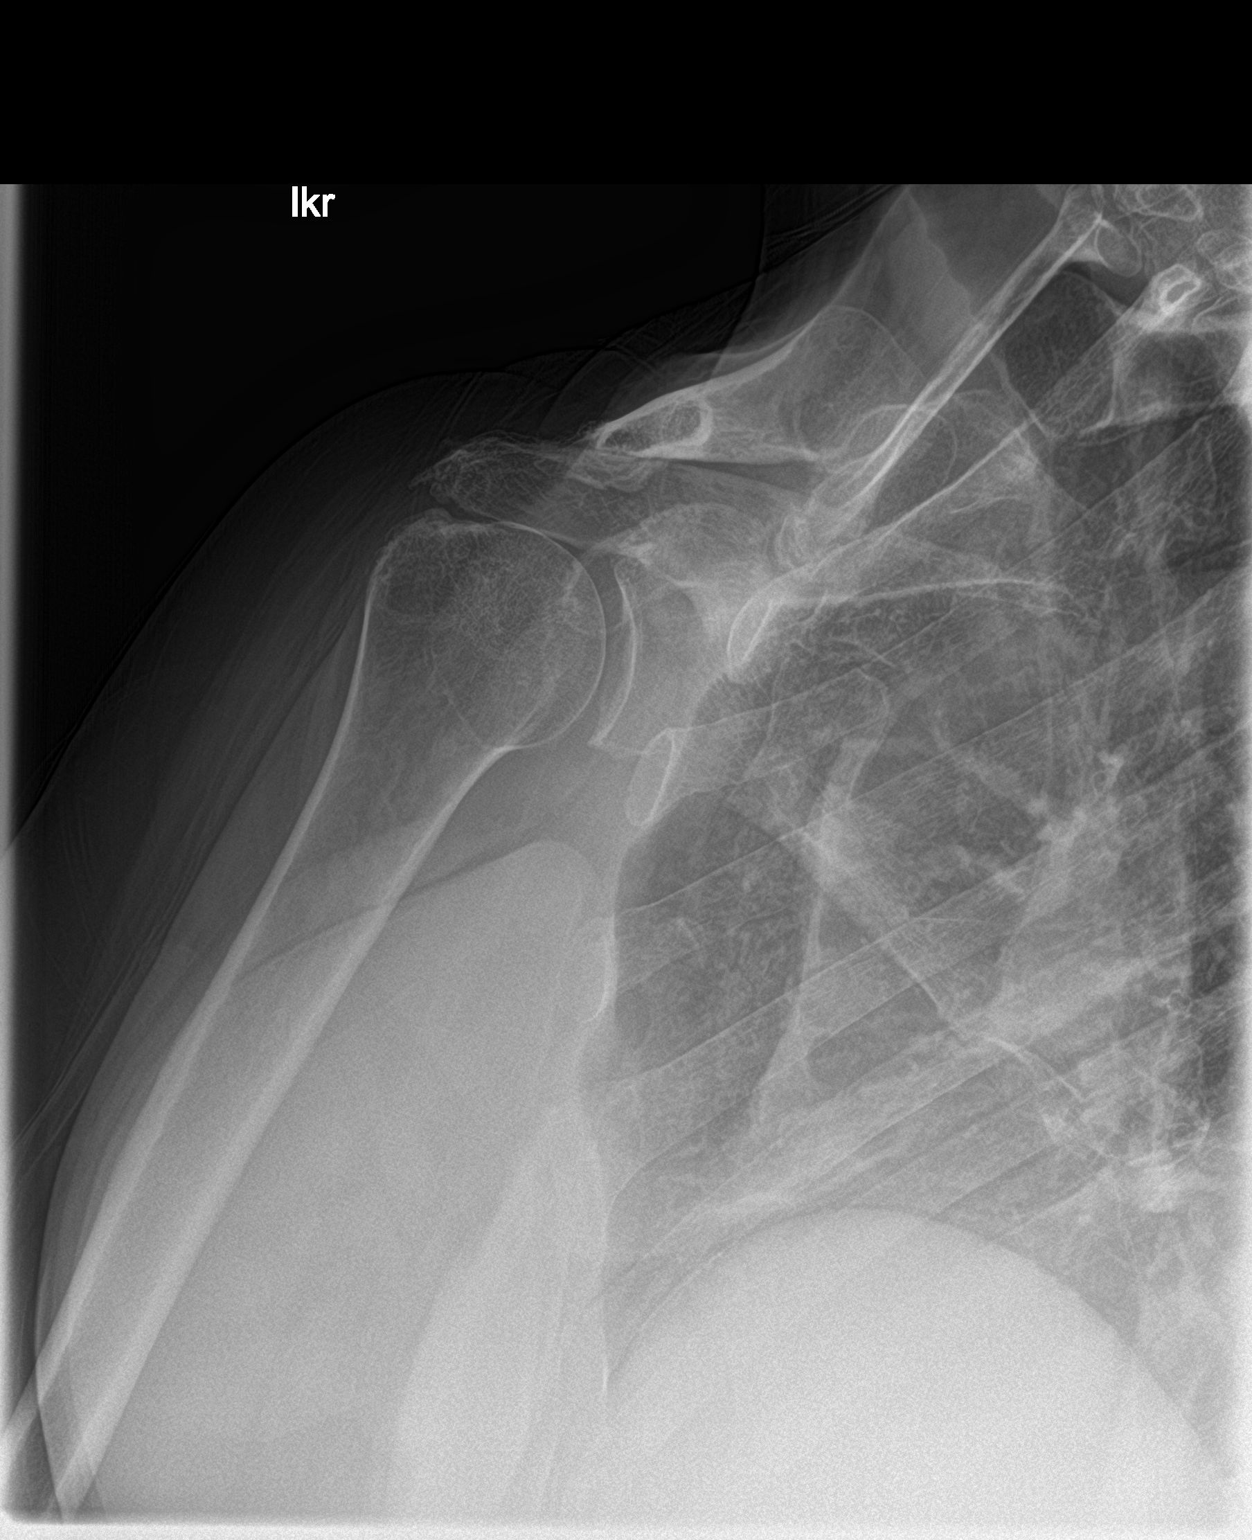
[im 2/3]
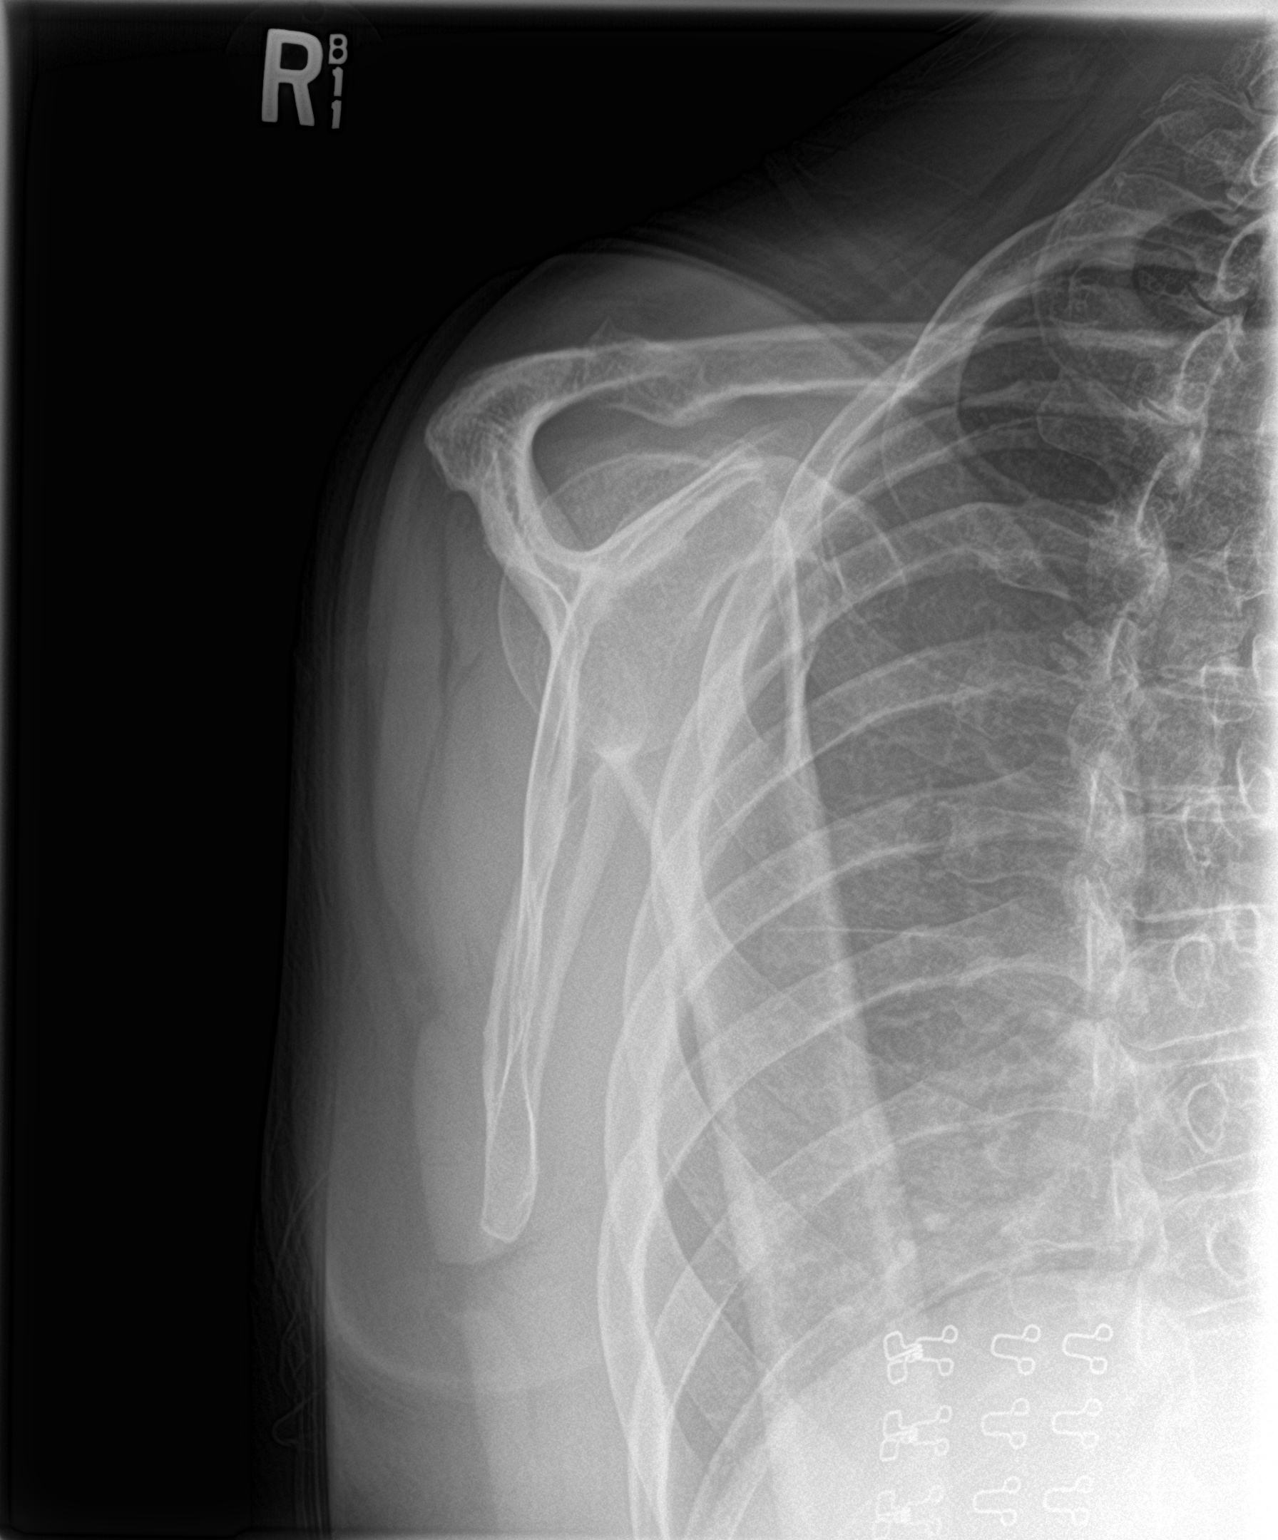
[im 3/3]
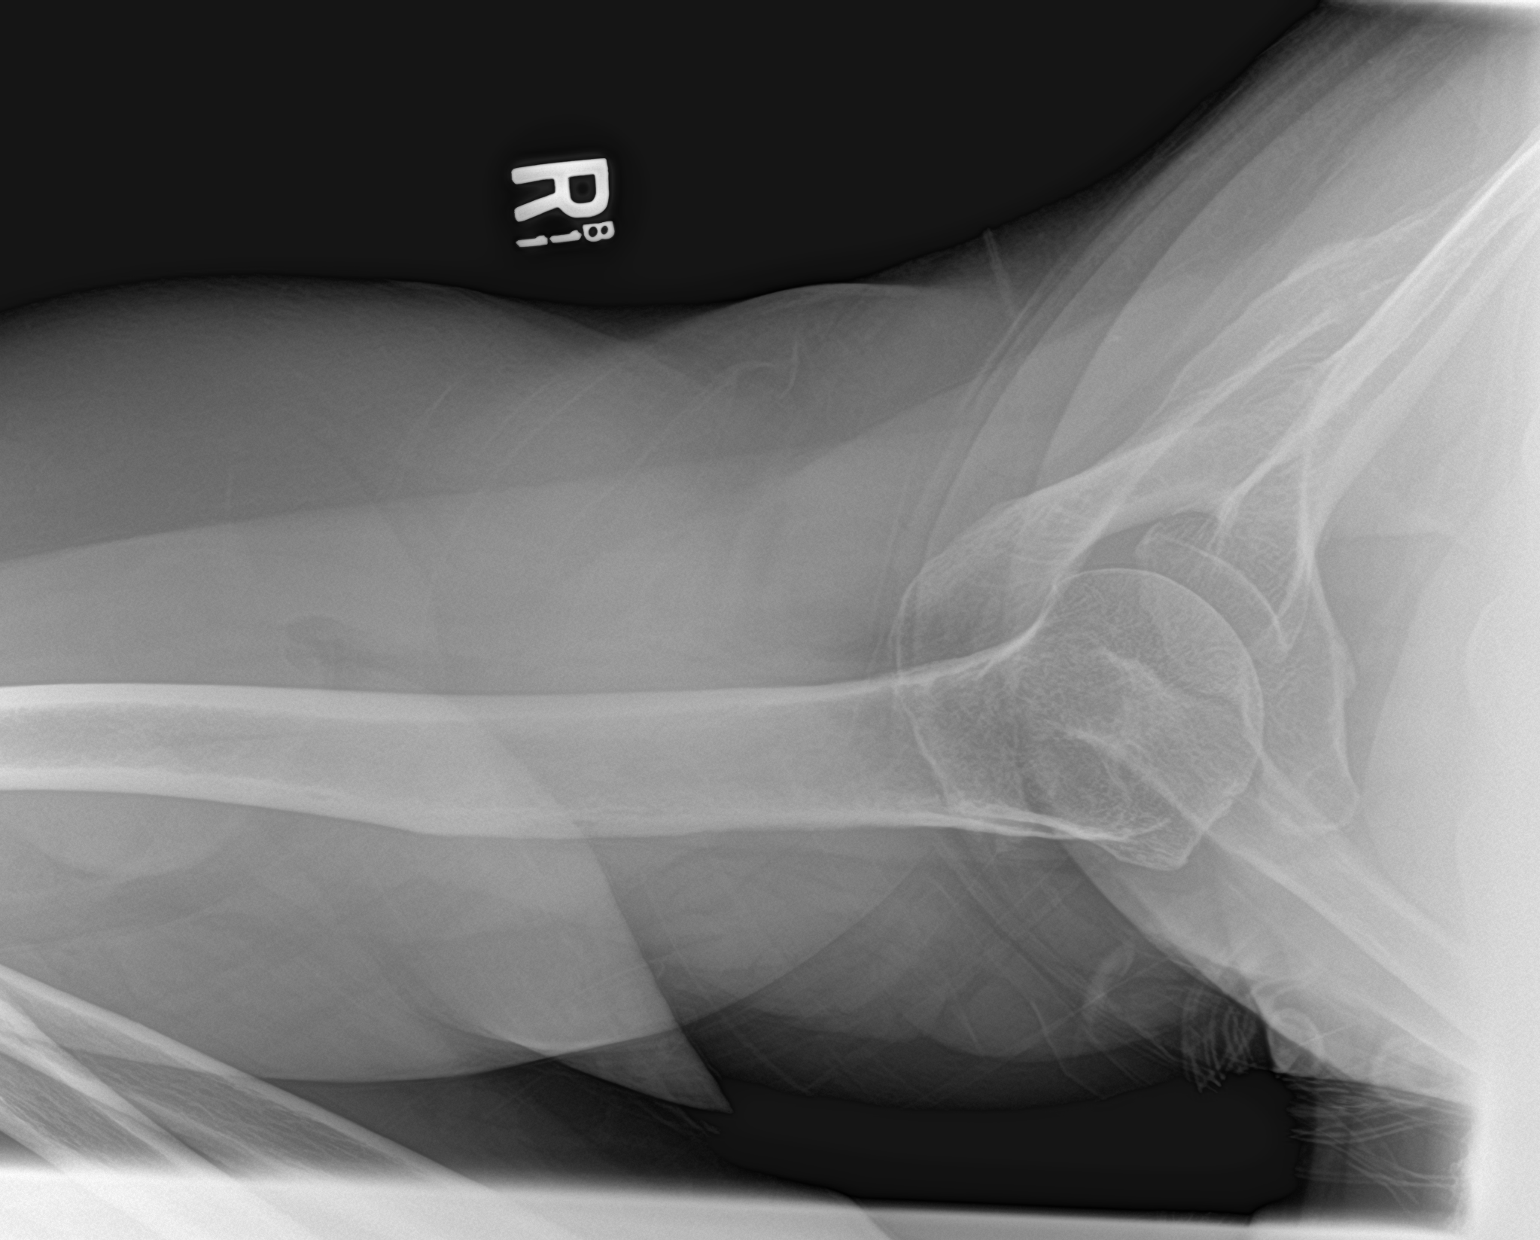

[3 of 3 positions shown; findings below may reference images not displayed]

FINDINGS: The bones are subjectively adequately mineralized. The glenohumeral
joint space is well maintained. There is mild narrowing of the
subacromial subdeltoid space. Some narrowing of the AC joint is
suspected but the AC joint is not well demonstrated. No acute bony
abnormality is observed.
IMPRESSION: Narrowing of the subacromial subdeltoid space. No significant
abnormality of the glenohumeral joint. Probable mild degenerative
change of the AC joint.

## 2019-02-07 IMAGING — MG 2D DIGITAL SCREENING UNILATERAL LEFT MAMMOGRAM WITH CAD AND ADJU
6 series · 6 of 14 positions shown · non-contrast
Comparison: Previous exam(s).

ACR Breast Density Category a: The breast tissue is almost entirely
fatty.

CLINICAL DATA: Screening.

EXAM:
2D DIGITAL SCREENING UNILATERAL LEFT MAMMOGRAM WITH CAD AND ADJUNCT
TOMO

[L MLO]
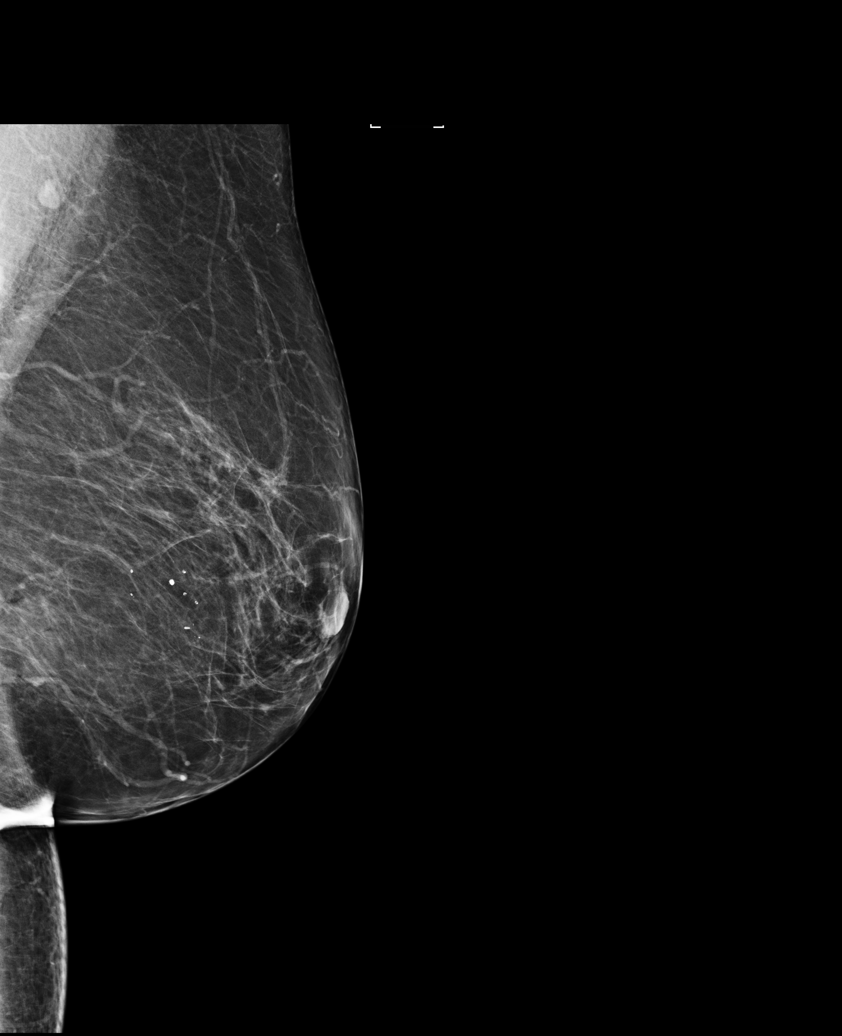

[L CC synth-2D]
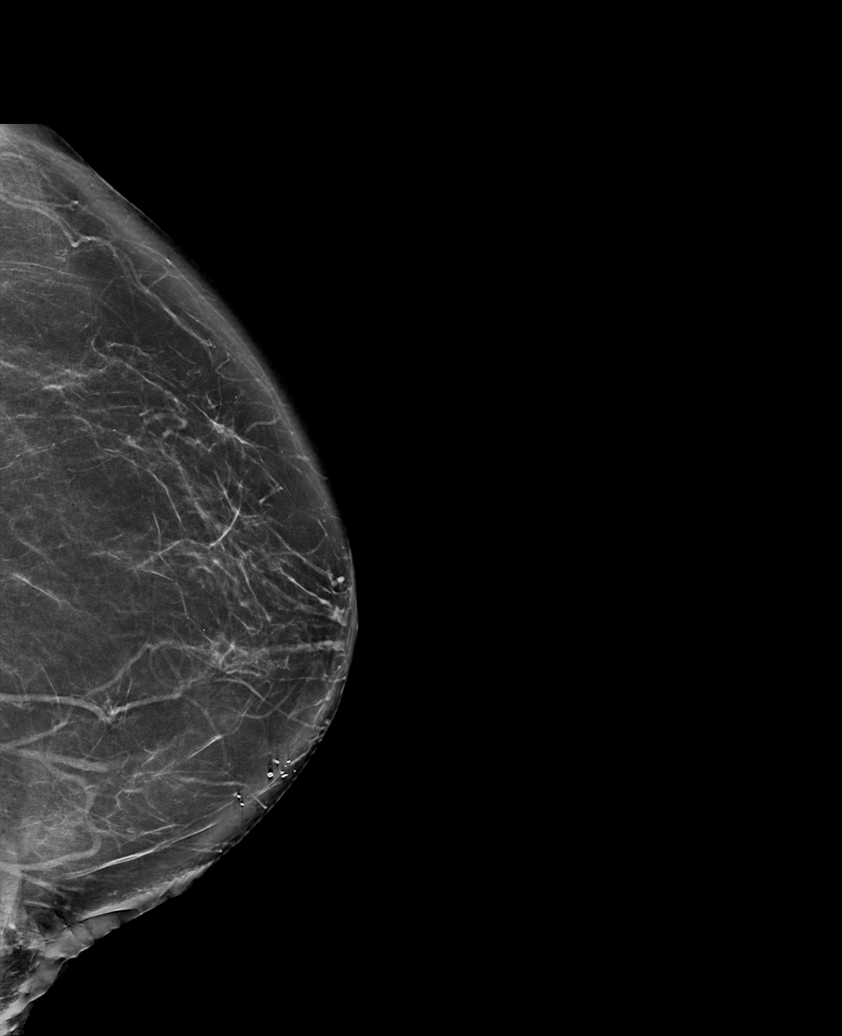

[L MLO synth-2D]
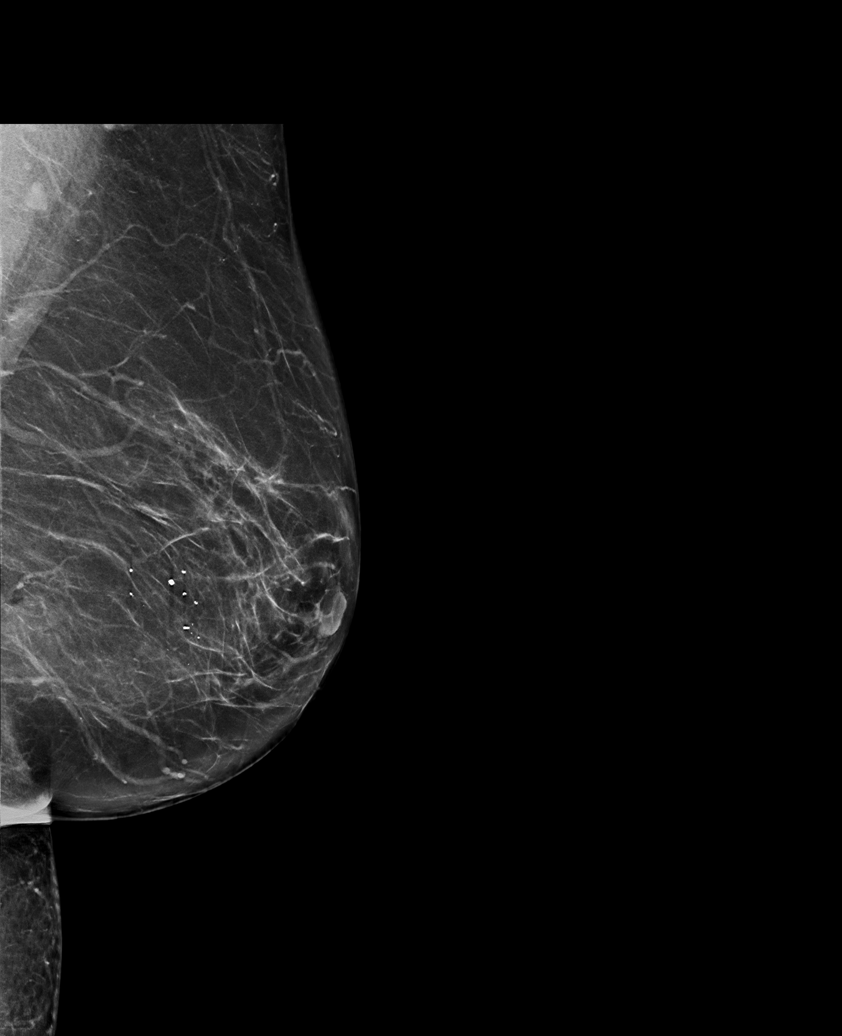

[L CC]
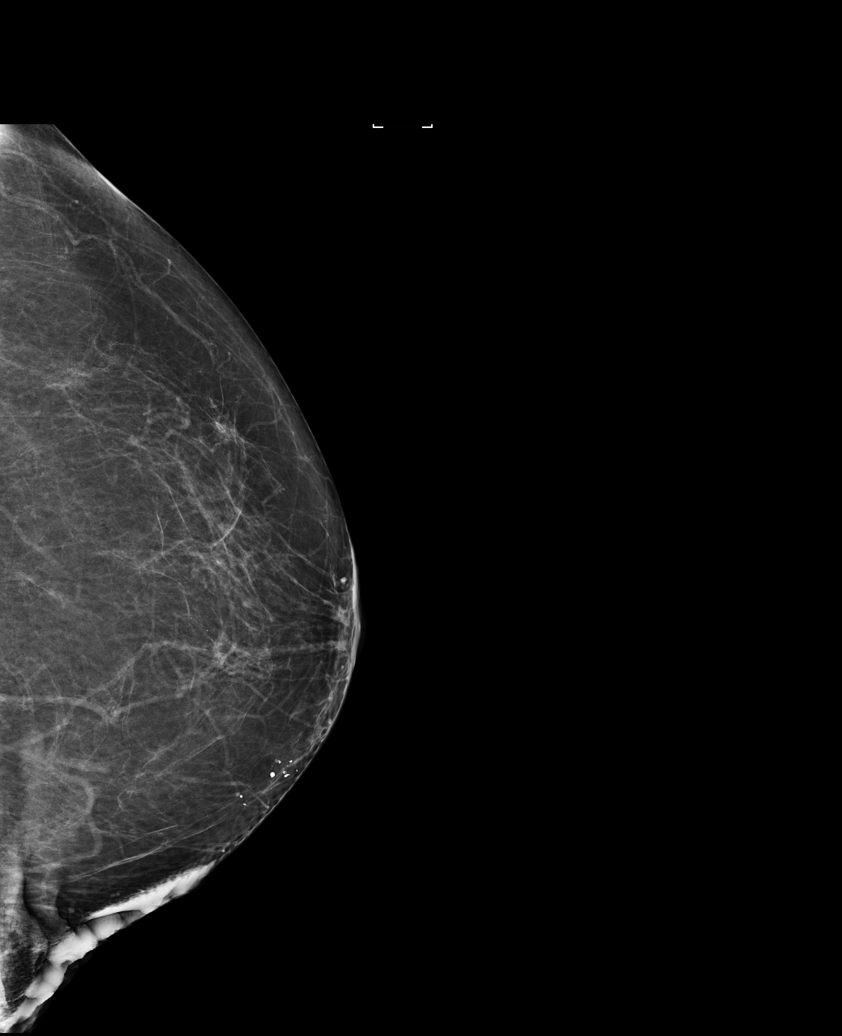

[L CC tomo · tomo slice 41/82.0]
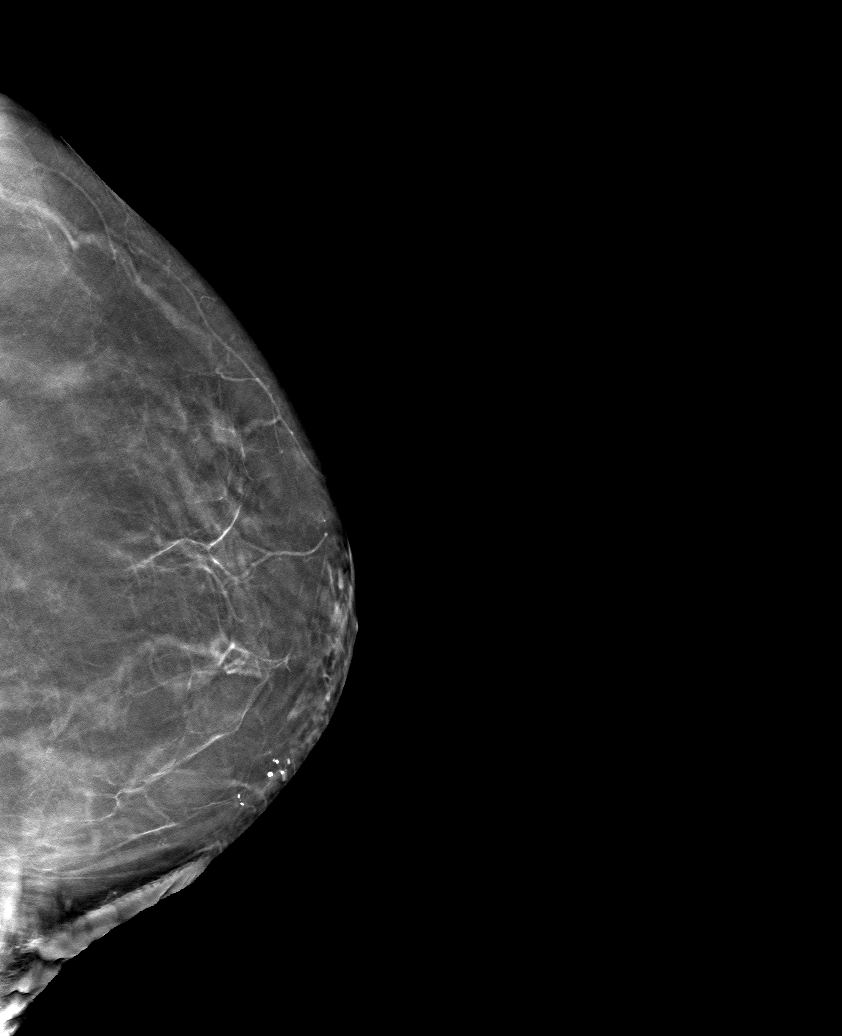

[L MLO tomo · tomo slice 38/75.0]
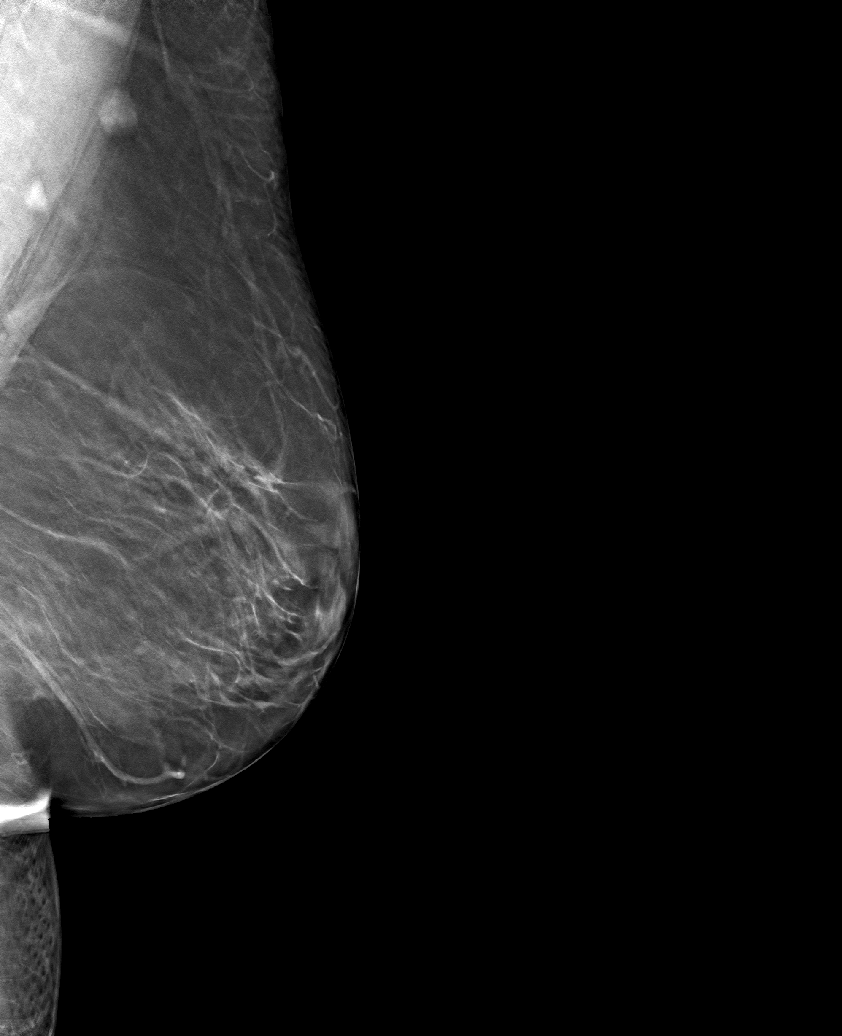

[6 of 14 positions shown; findings below may reference images not displayed]

FINDINGS: There are no findings suspicious for malignancy. Images were
processed with CAD.
IMPRESSION: No mammographic evidence of malignancy. A result letter of this
screening mammogram will be mailed directly to the patient.

RECOMMENDATION:
Screening mammogram in one year. (Code:[id number])

BI-RADS CATEGORY  1: Negative.

## 2019-04-29 IMAGING — US US ABDOMEN COMPLETE
1 series · 14 of 25 positions shown · non-contrast
Comparison: CT abdomen and pelvis December 31, 2016

CLINICAL DATA: One week history of abdominal pain

EXAM:
ABDOMEN ULTRASOUND COMPLETE

[Series 1: us abdomen complete · 0.23mm/px · 14 of 131 slices shown]
[im 1/131]
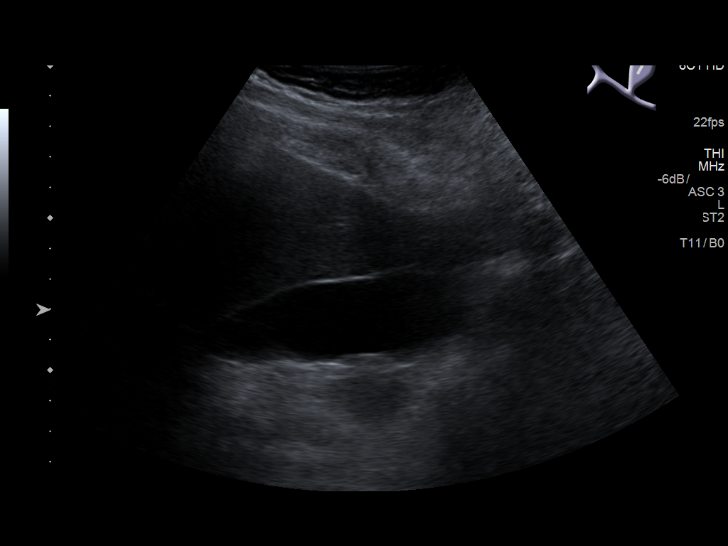
[im 11/131]
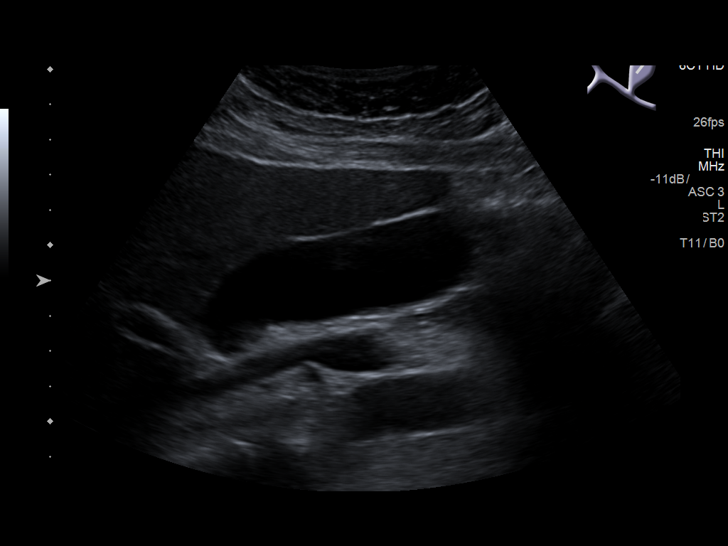
[im 22/131]
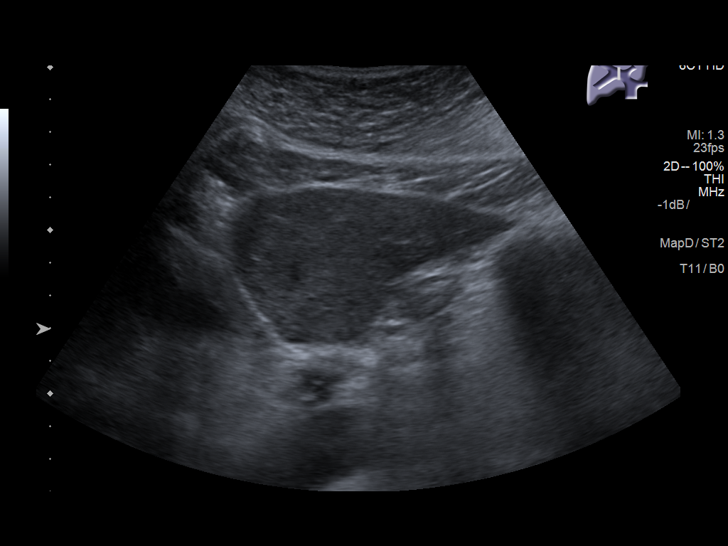
[im 33/131]
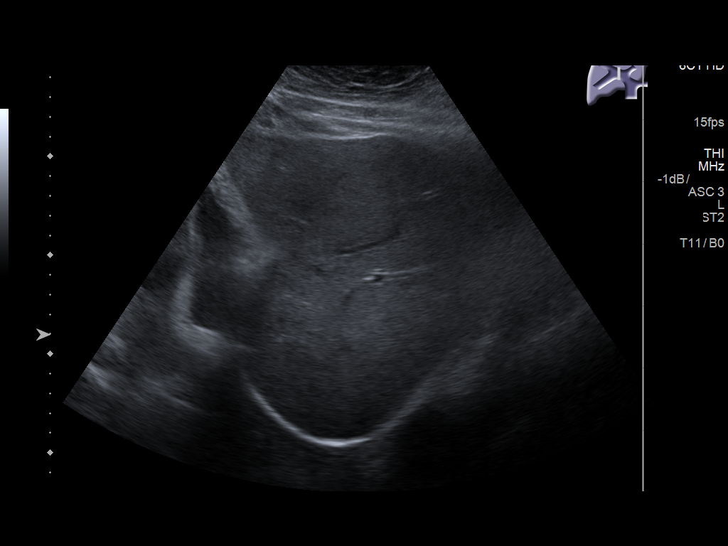
[im 44/131]
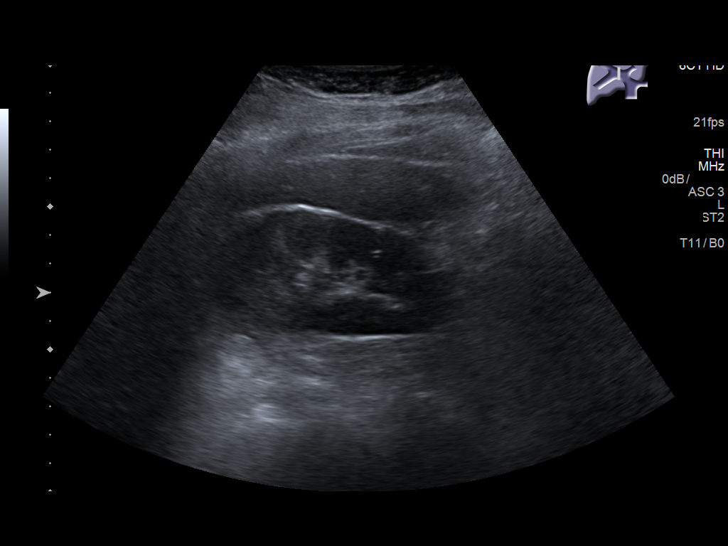
[im 49/131]
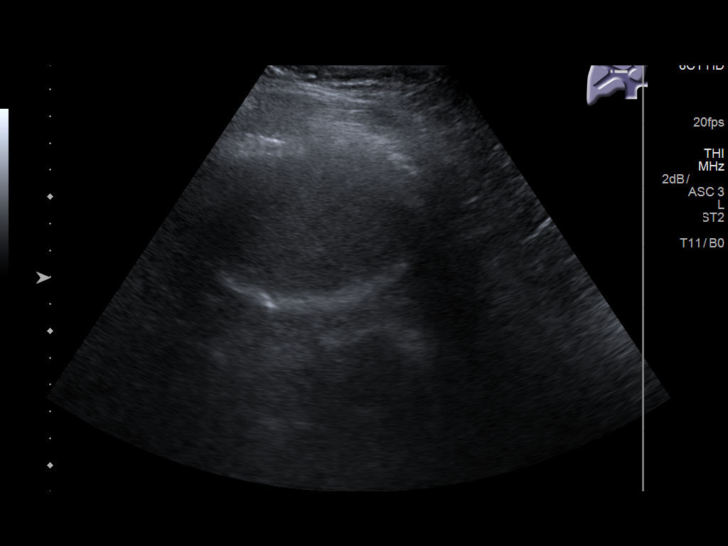
[im 60/131]
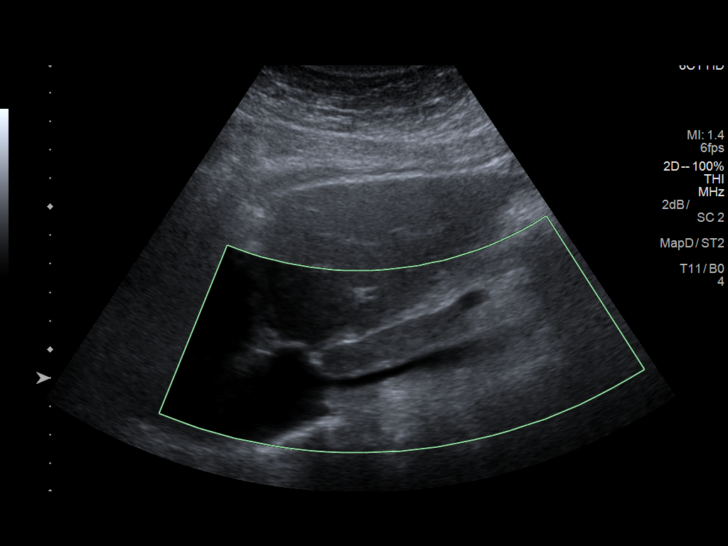
[im 71/131]
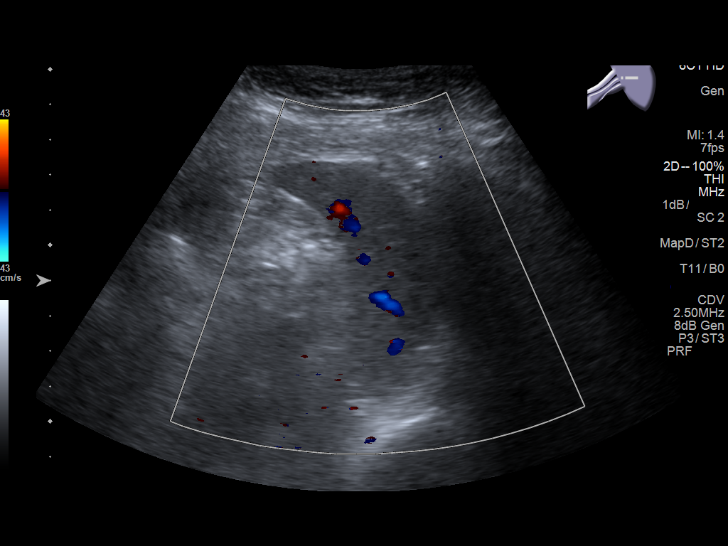
[im 82/131]
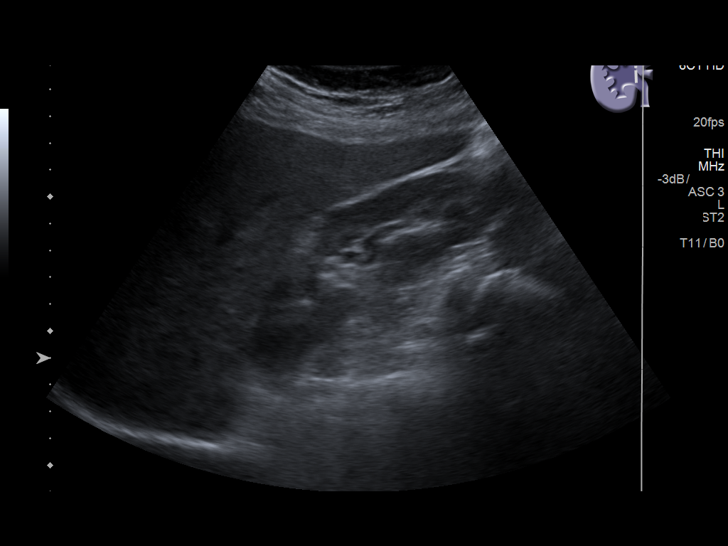
[im 87/131]
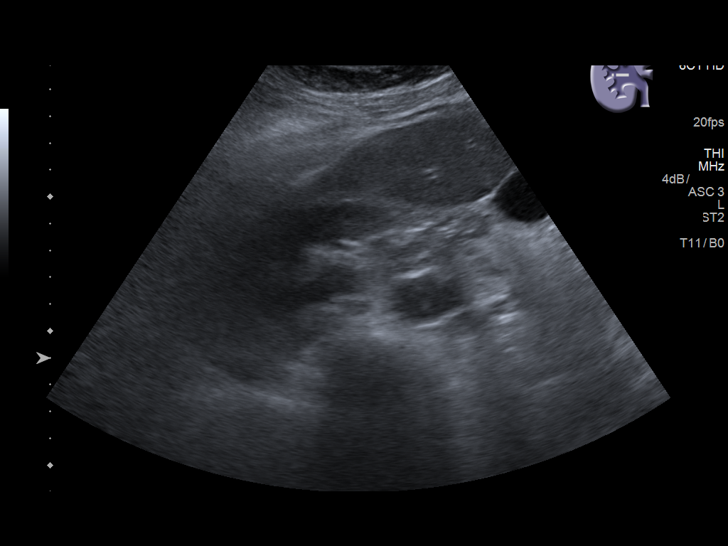
[im 98/131]
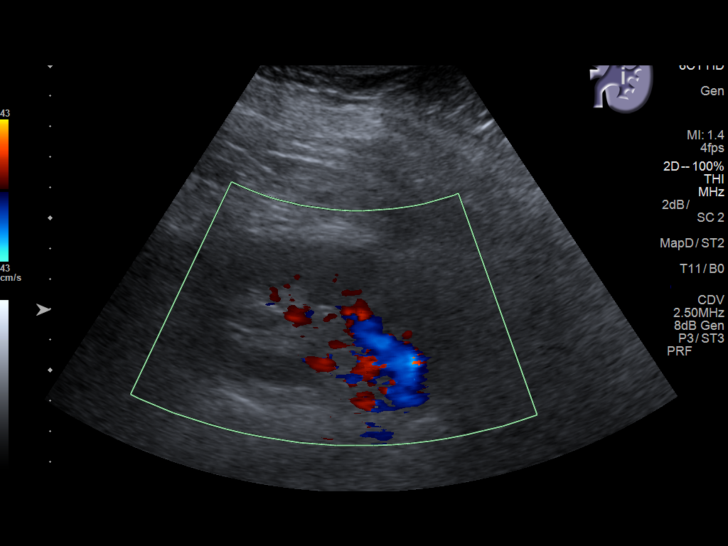
[im 109/131]
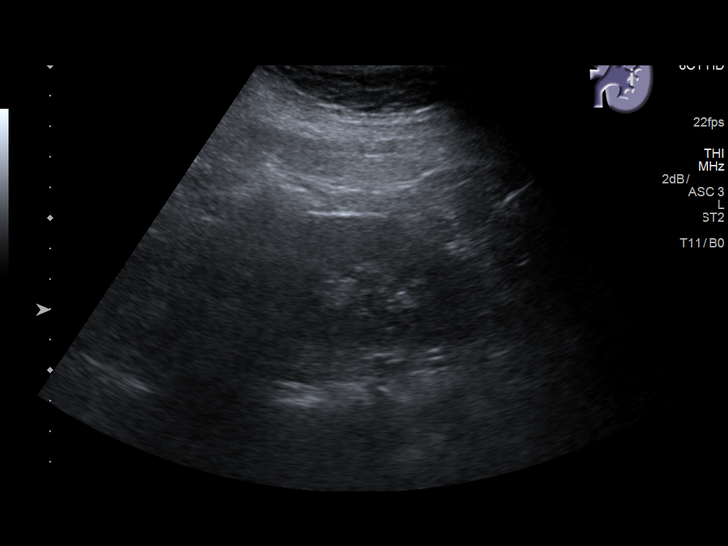
[im 120/131]
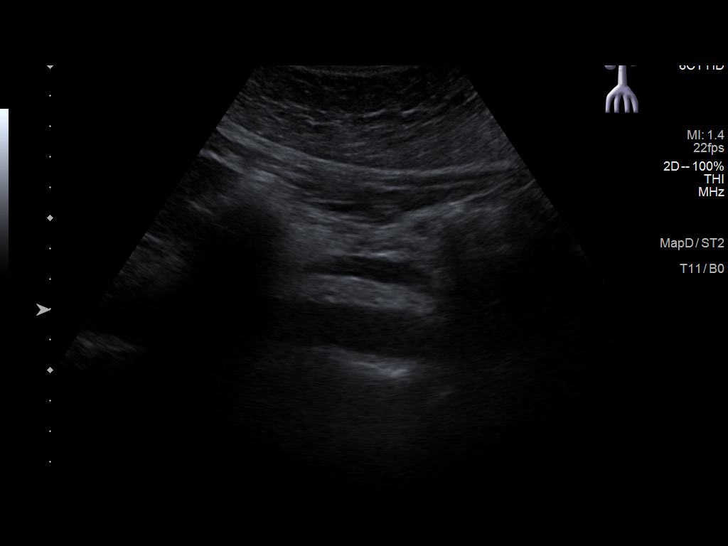
[im 131/131]
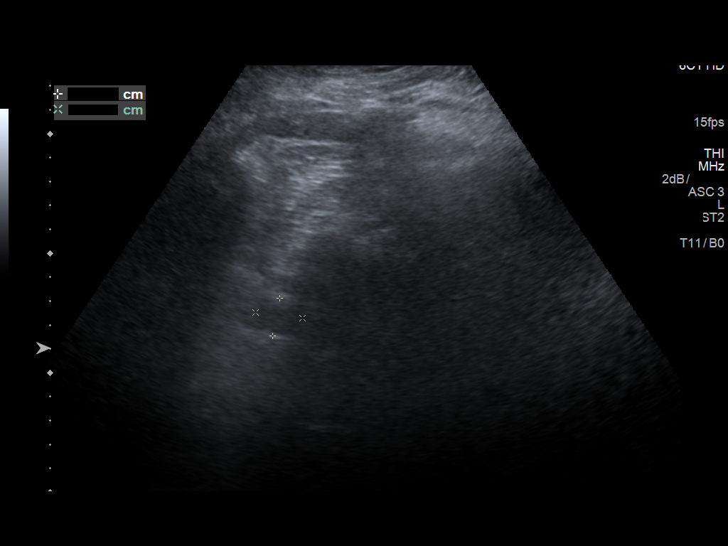

[14 of 25 positions shown; findings below may reference images not displayed]

FINDINGS: Gallbladder: No gallstones or wall thickening visualized. There is
no pericholecystic fluid. No sonographic Murphy sign noted by
sonographer.

Common bile duct: Diameter: 3 mm. No intrahepatic, common hepatic,
or common bile duct dilatation.

Liver: No focal lesion identified. Within normal limits in
parenchymal echogenicity. Portal vein is patent on color Doppler
imaging with normal direction of blood flow towards the liver.

IVC: No abnormality visualized.

Pancreas: Visualized portion unremarkable. Portions of pancreas
obscured by gas.

Spleen: Size and appearance within normal limits.

Right Kidney: Length: 11.4 cm. Echogenicity within normal limits. No
mass or hydronephrosis visualized.

Left Kidney: Length: 10.7 cm. Echogenicity within normal limits. No
mass or hydronephrosis visualized.

Abdominal aorta: No aneurysm visualized.

Other findings: No demonstrable ascites.
IMPRESSION: Portions of pancreas obscured by gas. Visualized portions of
pancreas appear normal.

Study otherwise unremarkable.

## 2019-05-12 ENCOUNTER — Other Ambulatory Visit: Payer: Self-pay | Admitting: Internal Medicine

## 2019-05-12 DIAGNOSIS — Z1231 Encounter for screening mammogram for malignant neoplasm of breast: Secondary | ICD-10-CM

## 2019-11-16 ENCOUNTER — Other Ambulatory Visit: Payer: Self-pay | Admitting: Internal Medicine

## 2019-11-16 DIAGNOSIS — Z1231 Encounter for screening mammogram for malignant neoplasm of breast: Secondary | ICD-10-CM

## 2019-12-03 ENCOUNTER — Ambulatory Visit: Payer: Medicare Other | Attending: Internal Medicine

## 2019-12-03 DIAGNOSIS — Z23 Encounter for immunization: Secondary | ICD-10-CM | POA: Insufficient documentation

## 2019-12-03 NOTE — Progress Notes (Signed)
   Covid-19 Vaccination Clinic  Name:  Nancy Day    MRN: [id number] DOB: 1951/08/17  12/03/2019  Nancy Day was observed post Covid-19 immunization for 15 minutes without incidence. She was provided with Vaccine Information Sheet and instruction to access the V-Safe system.   Nancy Day was instructed to call 911 with any severe reactions post vaccine: Marland Kitchen Difficulty breathing  . Swelling of your face and throat  . A fast heartbeat  . A bad rash all over your body  . Dizziness and weakness    Immunizations Administered    Name Date Dose VIS Date Route   Pfizer COVID-19 Vaccine 12/03/2019  9:55 AM 0.3 mL 09/30/2019 Intramuscular   Manufacturer: [place]   Lot: [id number]   [place]: [id number]

## 2019-12-15 ENCOUNTER — Ambulatory Visit
Admission: RE | Admit: 2019-12-15 | Discharge: 2019-12-15 | Disposition: A | Discharge status: Home / Self Care | Payer: Medicare Other | Source: Ambulatory Visit | Attending: Internal Medicine | Admitting: Internal Medicine

## 2019-12-15 DIAGNOSIS — Z1231 Encounter for screening mammogram for malignant neoplasm of breast: Secondary | ICD-10-CM | POA: Insufficient documentation

## 2019-12-24 ENCOUNTER — Ambulatory Visit: Payer: Medicare Other | Attending: Internal Medicine

## 2019-12-24 ENCOUNTER — Other Ambulatory Visit: Payer: Self-pay

## 2019-12-24 DIAGNOSIS — Z23 Encounter for immunization: Secondary | ICD-10-CM | POA: Insufficient documentation

## 2019-12-24 NOTE — Progress Notes (Signed)
   Covid-19 Vaccination Clinic  Name:  Nancy Day    MRN: [id number] DOB: 06-Feb-1951  12/24/2019  [name] was observed post Covid-19 immunization for 15 minutes without incident. She was provided with Vaccine Information Sheet and instruction to access the V-Safe system.   [name] was instructed to call 911 with any severe reactions post vaccine: Marland Kitchen Difficulty breathing  . Swelling of face and throat  . A fast heartbeat  . A bad rash all over body  . Dizziness and weakness   Immunizations Administered    Name Date Dose VIS Date Route   Pfizer COVID-19 Vaccine 12/24/2019  9:21 AM 0.3 mL 09/30/2019 Intramuscular   Manufacturer: [place]   Lot: [id number]   [place]: [id number]

## 2020-03-09 ENCOUNTER — Other Ambulatory Visit: Payer: Self-pay | Admitting: Internal Medicine

## 2020-03-09 DIAGNOSIS — T8543XA Leakage of breast prosthesis and implant, initial encounter: Secondary | ICD-10-CM

## 2020-05-01 ENCOUNTER — Other Ambulatory Visit: Payer: Self-pay | Admitting: Physical Medicine and Rehabilitation

## 2020-05-01 DIAGNOSIS — M5441 Lumbago with sciatica, right side: Secondary | ICD-10-CM

## 2020-05-15 ENCOUNTER — Other Ambulatory Visit: Payer: Medicare Other

## 2020-05-15 ENCOUNTER — Other Ambulatory Visit: Payer: Self-pay | Admitting: Internal Medicine

## 2020-05-15 DIAGNOSIS — T8543XA Leakage of breast prosthesis and implant, initial encounter: Secondary | ICD-10-CM

## 2020-05-24 ENCOUNTER — Other Ambulatory Visit: Payer: Self-pay

## 2020-05-24 ENCOUNTER — Other Ambulatory Visit: Payer: Self-pay | Admitting: Internal Medicine

## 2020-05-24 ENCOUNTER — Ambulatory Visit
Admission: RE | Admit: 2020-05-24 | Discharge: 2020-05-24 | Disposition: A | Discharge status: Home / Self Care | Payer: Medicare Other | Source: Ambulatory Visit | Attending: Internal Medicine | Admitting: Internal Medicine

## 2020-05-24 DIAGNOSIS — T8543XA Leakage of breast prosthesis and implant, initial encounter: Secondary | ICD-10-CM | POA: Diagnosis not present

## 2020-06-27 ENCOUNTER — Other Ambulatory Visit: Payer: Self-pay | Admitting: General Surgery

## 2020-06-27 NOTE — Progress Notes (Signed)
Subjective:     Patient ID: Nancy Day is a 69 y.o. female.  HPI  The following portions of the patient's history were reviewed and updated as appropriate.  This a new patient is here today for: office visit. Here for evaluation of her right breast implant leaking. States she has mild breast discomfort. Mammogram was this month.  The patient reported a sensation as if something was moving or dripping within the left chest wall.  This prompted mammography and identification of implant leak.  She is not reporting any trauma to the breast or injury to the implant.  No car accidents or falls.  The patient describes that she had surgery for breast cancer 1999 and reconstruction in 2012.  She described having a nodal dissection completed.  Pathology from her original tumor is not available as [place].  Reconstruction was completed in [place].  The patient reports that she is considering traveling to [place] to be evaluated by the physician on the TV show "[employer]" for assessment but the just under $400 fee for evaluation was a deal breaker. The patient reports that she had developed a keloid after her original surgery in 1999.  This area was excised at the time of implant placement but has recurred moving over to the medial aspect of the left breast and into the left inframammary fold.  While she has no pain she reports the area is pruritic, and aggravated by the band of her bra.   Review of Systems  Constitutional: Negative for chills and fatigue.  Respiratory: Negative for cough.         Chief Complaint  Patient presents with  . New Patient    right breast implant leak     BP 170/86   Pulse 83   Temp 36.2 C (97.1 F)   Ht 171.5 cm (5' 7.5")   Wt 98.4 kg (217 lb)   SpO2 97%   BMI 33.49 kg/m       Past Medical History:  Diagnosis Date  . Abnormal MRI, lumbar spine 08/19/2017   MRI of the lumbar spine  Comparison: none  Indication: Pain  with left leg  DOB: 09/14/1951  Technique: Lumbar HNP protocol MRI sequences of the lumbar spine were obtained without contrast.  Findings: Conus not low-lying. Normal alignment. Disc desiccation L2-L3 and L5-S1 with mild discogenic reactive changes at L5-S1. Normal bone marrow. No compression deformities.  T11-T12, T12-L1, L1-L2:Evaluated   . Anxiety   . Anxiety state 09/06/2012  . Attention deficit disorder (ADD) in adult 11/21/2010  . Benign essential HTN   . Breast cancer (CMS-HCC) 1999  . CAD (coronary artery disease)   . Chronic neck pain 06/16/2017  . Chronic pain of both shoulders 06/16/2017  . Chronic pain of lower extremity, bilateral 06/16/2017  . Chronic sacroiliac joint pain 06/16/2017  . Colon polyp   . Coronary atherosclerosis 02/14/2011   Cath on 10/14/06; 50-70% ostial circ,luminal irregularities in the rest, normal LV Cath on 10/14/06; 50-70% ostial circ,luminal irregularities in the rest, normal LV  . DDD (degenerative disc disease), lumbar   . Depression   . Disorder of skeletal system 08/18/2017  . Essential hypertension 02/15/2015  . Facet syndrome, lumbar 08/19/2017  . Fibromyalgia 1996  . Fibromyalgia 09/06/2012  . History of rotator cuff surgery 08/19/2017  . Lumbar foraminal stenosis 08/19/2017  . Mixed hyperlipidemia   . Myalgia and myositis 09/06/2012  . Neurogenic pain 08/30/2014  . Opiate use 08/18/2017  . Other  intervertebral disc degeneration, lumbosacral region 09/06/2012  . Other long term (current) drug therapy 04/07/2011   Chronic [Taking Medication For A Long Time] Chronic [Taking Medication For A Long Time]  . Primary osteoarthritis involving multiple joints 08/18/2017  . Problems influencing health status 08/18/2017  . Shortness of breath   . Spinal stenosis   . Spinal stenosis of lumbar region without neurogenic claudication 08/30/2014  . Tricompartment osteoarthritis of left knee 07/07/2017   Mild left knee tricompartmental OA  . Tubular  adenoma of colon, unspecified 11/21/2016  . Vitamin D deficiency 09/07/2015          Past Surgical History:  Procedure Laterality Date  . COLONOSCOPY  11/21/2016   Stricture at the splenic flexure/Tubular adenoma/Repeat CT Colon 8yrs/MUS  . COLONOSCOPY    . COLONOSCOPY  12/24/2018   Tortuous colon/PHx CP/Repeat 44yrs/TKT  . EGD  12/24/2018   Gastritis/Normal esophagus/No Repeat/TKT  . HYSTERECTOMY  1991  . MASTECTOMY Right 1999   Modified Radical Mastectomy  . RECONSTRUCTION BILATERAL BREAST DELAYED POST MASTOPEXY W/IMPLANT Right 2012   right total mastectomy, left reduction with implants right  . TUBAL LIGATION  1975      OB History   No obstetric history on file.     Social History          Socioeconomic History  . Marital status: Widowed    Spouse name: Not on file  . Number of children: Not on file  . Years of education: Not on file  . Highest education level: Not on file  Occupational History  . Not on file  Tobacco Use  . Smoking status: Former Smoker    Packs/day: 0.00    Years: 0.00    Pack years: 0.00    Quit date: 10/20/1984    Years since quitting: 35.6  . Smokeless tobacco: Never Used  Vaping Use  . Vaping Use: Never used  Substance and Sexual Activity  . Alcohol use: Not Currently    Comment: occasional  . Drug use: No  . Sexual activity: Not Currently    Partners: Male    Birth control/protection: None  Other Topics Concern  . Not on file  Social History Narrative   Widowed from second husband of 89 yrs in 4/14.  He was truck Geophysicist/field seismologist.  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  Has 4 children, estranged from oldest daughter in [place] who blames her for bad first marriage, has another daughter in [place], 1 son in [place] and 1 daughter in [place].    Social Determinants of Health      Financial Resource Strain:   . Difficulty of Paying Living Expenses:   Food Insecurity:   . Worried  About Charity fundraiser in the Last Year:   . Arboriculturist in the Last Year:   Transportation Needs:   . Film/video editor (Medical):   Marland Kitchen Lack of Transportation (Non-Medical):             Allergies  Allergen Reactions  . Ace Inhibitors Vomiting  . Hydralazine Analogues Vomiting    Other reaction(s): OTHER  . Metoprolol Other (See Comments)    DIZZY AND FAINT  . Opioids - Morphine Analogues Vomiting  . Opium Vomiting    Other reaction(s): NAUSEA  . Codeine Nausea  . Hydrocodone Nausea    Current Medications        Current Outpatient Medications  Medication Sig Dispense Refill  . aspirin 81 MG  EC tablet Take 81 mg by mouth.    . cyclobenzaprine (FLEXERIL) 10 MG tablet Take 1 tablet (10 mg total) by mouth every 8 (eight) hours as needed 100 tablet 3  . diltiazem (CARDIZEM CD) 180 MG CD capsule Take 1 capsule (180 mg total) by mouth once daily 90 capsule 1  . DULoxetine (CYMBALTA) 30 MG DR capsule Take 2 capsules (60 mg total) by mouth once daily 180 capsule 3  . potassium chloride (KLOR-CON) 10 mEq ER tablet Take 1 tablet (10 mEq total) by mouth 2 (two) times daily 28 tablet 0  . potassium chloride (KLOR-CON) 10 mEq ER tablet Take 1 tablet (10 mEq total) by mouth 2 (two) times daily 180 tablet 3  . QUEtiapine (SEROQUEL) 50 MG tablet Take 1 tablet (50 mg total) by mouth nightly for 30 days 90 tablet 3  . rosuvastatin (CRESTOR) 20 MG tablet Take 1 tablet (20 mg total) by mouth once daily 90 tablet 3  . telmisartan-hydrochlorothiazide (MICARDIS HCT) 80-25 mg tablet Take 1 tablet by mouth once daily 90 tablet 3   No current facility-administered medications for this visit.           Family History  Problem Relation Age of Onset  . High blood pressure (Hypertension) Maternal Grandmother   . Coronary Artery Disease (Blocked arteries around heart) Maternal Grandmother   . Hyperlipidemia (Elevated cholesterol) Paternal Grandmother   . High blood pressure  (Hypertension) Paternal Grandmother   . Arthritis Paternal Grandmother   . Hyperlipidemia (Elevated cholesterol) Paternal Grandfather   . High blood pressure (Hypertension) Paternal Grandfather   . Osteoarthritis Paternal Grandfather           Objective:   Physical Exam Chest:       Comments: Photo in media section. Lymphadenopathy:     Upper Body:     Right upper body: No supraclavicular or axillary adenopathy.     Left upper body: No supraclavicular or axillary adenopathy.      Labs and Radiology:   June 13, 2020 bilateral mammograms and ultrasound had previously were personally reviewed.  IMPRESSION: Findings supporting extracapsular silicone rupture of the right breast implant. No findings of malignancy in the right reconstructed breast.  The patient's implant identification card described a Natrelle silicone filled breast implant, serial #[id number].  Implant date was April 21, 2011.  Implant placed by Rhett High, MD    Assessment:     Leaking right breast silicon prosthesis based on imaging.    Plan:     The patient reports that her husband died a few years ago, and she is not interested in having a new implant placed.  Informal consultation with plastic surgery was obtained regarding the potential for removal of implant and management of the keloid at the same setting.  This was discouraged.  The patient will be notified of the opportunity to have implant removal without replacement followed by later assessment/management of the keloid.  She was reassured that the small amount of silicon that his leaking is not moving at any rapid pace and is of little risk to her health.  Follow-up will be based on patient needs and desires.    Entered by Karie Fetch, RN, acting as a scribe for Dr. Hervey Ard, MD.  The documentation recorded by the scribe accurately reflects the service I personally performed and the decisions made by me.   Robert Bellow, MD FACS

## 2020-07-05 ENCOUNTER — Other Ambulatory Visit
Admission: RE | Admit: 2020-07-05 | Discharge: 2020-07-05 | Disposition: A | Discharge status: Home / Self Care | Payer: Medicare Other | Source: Ambulatory Visit | Attending: General Surgery | Admitting: General Surgery

## 2020-07-05 NOTE — Patient Instructions (Addendum)
Your procedure is scheduled on: Wed. 9/22 Report to Day Surgery. To find out your arrival time please call [phone] between 1PM - 3PM on .Tues 9/21  Remember: Instructions that are not followed completely may result in serious medical risk,  up to and including death, or upon the discretion of your surgeon and anesthesiologist your  surgery may need to be rescheduled.     _X__ 1. Do not eat food after midnight the night before your procedure.                 No chewing gum or hard candies. You may drink clear liquids up to 2 hours                 before you are scheduled to arrive for your surgery- DO not drink clear                 liquids within 2 hours of the start of your surgery.                 Clear Liquids include:  water, apple juice without pulp, clear Gatorade, G2 or                  Gatorade Zero (avoid Red/Purple/Blue), Black Coffee or Tea (Do not add                 anything to coffee or tea). _____2.   Complete the "Ensure Clear Pre-surgery Clear Carbohydrate Drink" provided to you, 2 hours before arrival. **If you       are diabetic you will be provided with an alternative drink, Gatorade Zero or G2.  __X__2.  On the morning of surgery brush your teeth with toothpaste and water, you                may rinse your mouth with mouthwash if you wish.  Do not swallow any toothpaste of mouthwash.     _X__ 3.  No Alcohol for 24 hours before or after surgery.   _X__ 4.  Do Not Smoke or use e-cigarettes For 24 Hours Prior to Your Surgery.                 Do not use any chewable tobacco products for at least 6 hours prior to                 Surgery.  _X__  5.  Do not use any recreational drugs (marijuana, cocaine, heroin, ecstasy, MDMA or other)                For at least one week prior to your surgery.  Combination of these drugs with anesthesia                May have life threatening results.  ____  6.  Bring all medications with you on the day of  surgery if instructed.   __x__  7.  Notify your doctor if there is any change in your medical condition      (cold, fever, infections).     Do not wear jewelry, make-up, hairpins, clips or nail polish. Do not wear lotions, powders, or perfumes. Do not shave 48 hours prior to surgery.  Do not bring valuables to the hospital.    [facility] is not responsible for any belongings or valuables.  Contacts, dentures or bridgework may not be worn into surgery. Leave your suitcase in the car. After surgery it may be  brought to your room. For patients admitted to the hospital, discharge time is determined by your treatment team.   Patients discharged the day of surgery will not be allowed to drive home.   Make arrangements for someone to be with you for the first 24 hours of your Same Day Discharge.    Please read over the following fact sheets that you were given:    _x___ Take these medicines the morning of surgery with A SIP OF WATER:    1. DULoxetine (CYMBALTA) 30 MG capsule if morning med  2. QUEtiapine (SEROQUEL) 50 MG tablet if morning med  3. rosuvastatin (CRESTOR) 20 MG tablet if morning med  4.cyclobenzaprine (FLEXERIL) 10 MG tablet if needed  5.  6.  ____ Fleet Enema (as directed)   __x__ Use CHG Soap (or wipes) as directed  ____ Use Benzoyl Peroxide Gel as instructed  ____ Use inhalers on the day of surgery  ____ Stop metformin 2 days prior to surgery    ____ Take 1/2 of usual insulin dose the night before surgery. No insulin the morning          of surgery.   ____ Stop Coumadin/Plavix/aspirin on   __x__ Stop Anti-inflammatories no ibuprofen aleve or aspirin products until after surgery   __x__ Stop supplements until after surgery.  BIOTIN PO,Turmeric POWD  ____ Bring C-Pap to the hospital.    If you have any questions regarding your pre-procedure instructions,  Please call Pre-admit Testing at  [phone]

## 2020-07-09 ENCOUNTER — Other Ambulatory Visit: Payer: Medicare Other

## 2020-07-09 ENCOUNTER — Encounter
Admission: RE | Admit: 2020-07-09 | Discharge: 2020-07-09 | Disposition: A | Discharge status: Home / Self Care | Payer: Medicare Other | Source: Ambulatory Visit | Attending: General Surgery | Admitting: General Surgery

## 2020-07-09 NOTE — Pre-Procedure Instructions (Signed)
Attempted to reach patient via phone since she has not shown for her P.A.T. visit or covid testing.  No answer but left a message.  Called Dr. Curly Shores office to update him as to this status.

## 2020-07-10 ENCOUNTER — Encounter
Admission: RE | Admit: 2020-07-10 | Discharge: 2020-07-10 | Disposition: A | Discharge status: Home / Self Care | Payer: Medicare Other | Source: Ambulatory Visit | Attending: General Surgery | Admitting: General Surgery

## 2020-07-10 ENCOUNTER — Other Ambulatory Visit: Payer: Self-pay

## 2020-07-10 DIAGNOSIS — Z01818 Encounter for other preprocedural examination: Secondary | ICD-10-CM | POA: Diagnosis not present

## 2020-07-10 DIAGNOSIS — I10 Essential (primary) hypertension: Secondary | ICD-10-CM | POA: Diagnosis not present

## 2020-07-10 DIAGNOSIS — Z20822 Contact with and (suspected) exposure to covid-19: Secondary | ICD-10-CM | POA: Diagnosis not present

## 2020-07-10 HISTORY — DX: Other complications of anesthesia, initial encounter: T88.59XA

## 2020-07-10 NOTE — Patient Instructions (Signed)
Your procedure is scheduled on: Tomorrow Report to Day Surgery. To find out your arrival time please call [phone] between 1PM - 3PM on today  Remember: Instructions that are not followed completely may result in serious medical risk,  up to and including death, or upon the discretion of your surgeon and anesthesiologist your  surgery may need to be rescheduled.     _X__ 1. Do not eat food after midnight the night before your procedure.                 No chewing gum or hard candies. You may drink clear liquids up to 2 hours                 before you are scheduled to arrive for your surgery- DO not drink clear                 liquids within 2 hours of the start of your surgery.                 Clear Liquids include:  water, apple juice without pulp, clear Gatorade, G2 or                  Gatorade Zero (avoid Red/Purple/Blue), Black Coffee or Tea (Do not add                 anything to coffee or tea). _____2.   Complete the "Ensure Clear Pre-surgery Clear Carbohydrate Drink" provided to you, 2 hours before arrival.   __X__2.  On the morning of surgery brush your teeth with toothpaste and water, you                may rinse your mouth with mouthwash if you wish.  Do not swallow any toothpaste of mouthwash.     _X__ 3.  No Alcohol for 24 hours before or after surgery.   ___ 4.  Do Not Smoke or use e-cigarettes For 24 Hours Prior to Your Surgery.                 Do not use any chewable tobacco products for at least 6 hours prior to                 Surgery.  ___  5.  Do not use any recreational drugs (marijuana, cocaine, heroin, ecstasy, MDMA or other)                For at least one week prior to your surgery.  Combination of these drugs with anesthesia                May have life threatening results.  ____  6.  Bring all medications with you on the day of surgery if instructed.   __x__  7.  Notify your doctor if there is any change in your medical condition       (cold, fever, infections).     Do not wear jewelry, make-up, hairpins, clips or nail polish. Do not wear lotions, powders, or perfumes. You may wear deodorant. Do not shave 48 hours prior to surgery. Do not bring valuables to the hospital.    [facility] is not responsible for any belongings or valuables.  Contacts, dentures or bridgework may not be worn into surgery. Leave your suitcase in the car. After surgery it may be brought to your room. For patients admitted to the hospital, discharge time is determined by your treatment team.  Patients discharged the day of surgery will not be allowed to drive home.   Make arrangements for someone to be with you for the first 24 hours of your Same Day Discharge.    Please read over the following fact sheets that you were given:    __x__ Take these medicines the morning of surgery with A SIP OF WATER:    1. diltiazem (CARDIZEM CD) 180 MG 24 hr capsule  2. cyclobenzaprine (FLEXERIL) 10 MG tablet if needed  3. DULoxetine (CYMBALTA) 30 MG capsule  4.  5.  6.  ____ Fleet Enema (as directed)   _x___ Use CHG Soap (or wipes) as directed  ____ Use Benzoyl Peroxide Gel as instructed  ____ Use inhalers on the day of surgery  ____ Stop metformin 2 days prior to surgery    ____ Take 1/2 of usual insulin dose the night before surgery. No insulin the morning          of surgery.   ____ Stop Coumadin/Plavix/aspirin on   _x___ Stop Anti-inflammatories ibuprofen aleve or aspirin    _x___ Stop supplements until after surgery.  BIOTIN PO,Turmeric POWD   ____ Bring C-Pap to the hospital.    If you have any questions regarding your pre-procedure instructions,  Please call Pre-admit Testing at [place]

## 2020-07-11 ENCOUNTER — Ambulatory Visit: Payer: Medicare Other | Admitting: Anesthesiology

## 2020-07-11 ENCOUNTER — Other Ambulatory Visit: Payer: Self-pay

## 2020-07-11 ENCOUNTER — Ambulatory Visit
Admission: RE | Admit: 2020-07-11 | Discharge: 2020-07-11 | Disposition: A | Discharge status: Home / Self Care | Payer: Medicare Other | Attending: General Surgery | Admitting: General Surgery

## 2020-07-11 ENCOUNTER — Encounter: Payer: Self-pay | Admitting: General Surgery

## 2020-07-11 ENCOUNTER — Encounter
Admission: RE | Disposition: A | Discharge status: Home / Self Care | Payer: Self-pay | Source: Home / Self Care | Attending: General Surgery

## 2020-07-11 DIAGNOSIS — Z79899 Other long term (current) drug therapy: Secondary | ICD-10-CM | POA: Diagnosis not present

## 2020-07-11 DIAGNOSIS — T8549XA Other mechanical complication of breast prosthesis and implant, initial encounter: Secondary | ICD-10-CM | POA: Diagnosis not present

## 2020-07-11 DIAGNOSIS — I2510 Atherosclerotic heart disease of native coronary artery without angina pectoris: Secondary | ICD-10-CM | POA: Diagnosis not present

## 2020-07-11 DIAGNOSIS — I10 Essential (primary) hypertension: Secondary | ICD-10-CM | POA: Insufficient documentation

## 2020-07-11 DIAGNOSIS — F329 Major depressive disorder, single episode, unspecified: Secondary | ICD-10-CM | POA: Insufficient documentation

## 2020-07-11 DIAGNOSIS — Z853 Personal history of malignant neoplasm of breast: Secondary | ICD-10-CM | POA: Diagnosis not present

## 2020-07-11 DIAGNOSIS — K219 Gastro-esophageal reflux disease without esophagitis: Secondary | ICD-10-CM | POA: Insufficient documentation

## 2020-07-11 DIAGNOSIS — Y831 Surgical operation with implant of artificial internal device as the cause of abnormal reaction of the patient, or of later complication, without mention of misadventure at the time of the procedure: Secondary | ICD-10-CM | POA: Diagnosis not present

## 2020-07-11 DIAGNOSIS — Z87891 Personal history of nicotine dependence: Secondary | ICD-10-CM | POA: Insufficient documentation

## 2020-07-11 DIAGNOSIS — M797 Fibromyalgia: Secondary | ICD-10-CM | POA: Diagnosis not present

## 2020-07-11 DIAGNOSIS — F419 Anxiety disorder, unspecified: Secondary | ICD-10-CM | POA: Diagnosis not present

## 2020-07-11 HISTORY — PX: BREAST IMPLANT REMOVAL: SHX5361

## 2020-07-11 LAB — SARS CORONAVIRUS 2 (TAT 6-24 HRS): SARS Coronavirus 2: NEGATIVE

## 2020-07-11 SURGERY — REMOVAL, IMPLANT, BREAST
Anesthesia: General | Laterality: Right

## 2020-07-11 MED ORDER — FENTANYL CITRATE (PF) 100 MCG/2ML IJ SOLN: 25.0000 ug | INTRAMUSCULAR | Status: DC | PRN

## 2020-07-11 MED ORDER — ACETAMINOPHEN 10 MG/ML IV SOLN
INTRAVENOUS | Status: DC | PRN
  Administered 2020-07-11: 1000 mg via INTRAVENOUS

## 2020-07-11 MED ORDER — ACETAMINOPHEN-CODEINE 300-30 MG PO TABS: 1.0000 | ORAL_TABLET | ORAL | 0 refills | Status: AC | PRN

## 2020-07-11 MED ORDER — CHLORHEXIDINE GLUCONATE CLOTH 2 % EX PADS: 6.0000 | MEDICATED_PAD | Freq: Once | CUTANEOUS | Status: DC

## 2020-07-11 MED ORDER — CHLORHEXIDINE GLUCONATE 0.12 % MT SOLN
OROMUCOSAL | Status: AC
  Administered 2020-07-11: 15 mL via OROMUCOSAL
  Filled 2020-07-11: qty 15

## 2020-07-11 MED ORDER — PROPOFOL 10 MG/ML IV BOLUS
INTRAVENOUS | Status: DC | PRN
  Administered 2020-07-11: 170 mg via INTRAVENOUS

## 2020-07-11 MED ORDER — LACTATED RINGERS IV SOLN: INTRAVENOUS | Status: DC

## 2020-07-11 MED ORDER — FENTANYL CITRATE (PF) 100 MCG/2ML IJ SOLN
INTRAMUSCULAR | Status: DC | PRN
Start: 2020-07-11 — End: 2020-07-11
  Administered 2020-07-11 (×2): 50 ug via INTRAVENOUS

## 2020-07-11 MED ORDER — MIDAZOLAM HCL 2 MG/2ML IJ SOLN
INTRAMUSCULAR | Status: DC | PRN
  Administered 2020-07-11: 2 mg via INTRAVENOUS

## 2020-07-11 MED ORDER — KETOROLAC TROMETHAMINE 30 MG/ML IJ SOLN
INTRAMUSCULAR | Status: DC | PRN
  Administered 2020-07-11: 30 mg via INTRAVENOUS

## 2020-07-11 MED ORDER — MIDAZOLAM HCL 2 MG/2ML IJ SOLN
INTRAMUSCULAR | Status: AC
  Filled 2020-07-11: qty 2

## 2020-07-11 MED ORDER — DEXMEDETOMIDINE HCL 200 MCG/2ML IV SOLN
INTRAVENOUS | Status: DC | PRN
  Administered 2020-07-11 (×2): 8 ug via INTRAVENOUS

## 2020-07-11 MED ORDER — DEXAMETHASONE SODIUM PHOSPHATE 10 MG/ML IJ SOLN
INTRAMUSCULAR | Status: DC | PRN
  Administered 2020-07-11: 10 mg via INTRAVENOUS

## 2020-07-11 MED ORDER — LIDOCAINE HCL (CARDIAC) PF 100 MG/5ML IV SOSY
PREFILLED_SYRINGE | INTRAVENOUS | Status: DC | PRN
  Administered 2020-07-11: 60 mg via INTRAVENOUS

## 2020-07-11 MED ORDER — BUPIVACAINE-EPINEPHRINE (PF) 0.5% -1:200000 IJ SOLN
INTRAMUSCULAR | Status: DC | PRN
  Administered 2020-07-11: 30 mL

## 2020-07-11 MED ORDER — ORAL CARE MOUTH RINSE: 15.0000 mL | Freq: Once | OROMUCOSAL | Status: AC

## 2020-07-11 MED ORDER — CHLORHEXIDINE GLUCONATE 0.12 % MT SOLN: 15.0000 mL | Freq: Once | OROMUCOSAL | Status: AC

## 2020-07-11 MED ORDER — PROPOFOL 10 MG/ML IV BOLUS
INTRAVENOUS | Status: AC
  Filled 2020-07-11: qty 20

## 2020-07-11 MED ORDER — ONDANSETRON HCL 4 MG/2ML IJ SOLN: 4.0000 mg | Freq: Once | INTRAMUSCULAR | Status: DC | PRN

## 2020-07-11 MED ORDER — ONDANSETRON HCL 4 MG/2ML IJ SOLN
INTRAMUSCULAR | Status: DC | PRN
  Administered 2020-07-11: 4 mg via INTRAVENOUS

## 2020-07-11 MED ORDER — FAMOTIDINE 20 MG PO TABS: 20.0000 mg | ORAL_TABLET | Freq: Once | ORAL | Status: AC

## 2020-07-11 MED ORDER — FAMOTIDINE 20 MG PO TABS
ORAL_TABLET | ORAL | Status: AC
  Administered 2020-07-11: 20 mg via ORAL
  Filled 2020-07-11: qty 1

## 2020-07-11 MED ORDER — LIDOCAINE HCL (PF) 2 % IJ SOLN
INTRAMUSCULAR | Status: AC
  Filled 2020-07-11: qty 5

## 2020-07-11 MED ORDER — ACETAMINOPHEN 10 MG/ML IV SOLN
INTRAVENOUS | Status: AC
  Filled 2020-07-11: qty 100

## 2020-07-11 MED ORDER — CEFAZOLIN SODIUM-DEXTROSE 2-4 GM/100ML-% IV SOLN
INTRAVENOUS | Status: AC
  Filled 2020-07-11: qty 100

## 2020-07-11 MED ORDER — FENTANYL CITRATE (PF) 100 MCG/2ML IJ SOLN
INTRAMUSCULAR | Status: AC
  Filled 2020-07-11: qty 2

## 2020-07-11 MED ORDER — CEFAZOLIN SODIUM-DEXTROSE 2-4 GM/100ML-% IV SOLN
2.0000 g | INTRAVENOUS | Status: AC
  Administered 2020-07-11: 2 g via INTRAVENOUS

## 2020-07-11 MED ORDER — DEXMEDETOMIDINE (PRECEDEX) IN NS 20 MCG/5ML (4 MCG/ML) IV SYRINGE
PREFILLED_SYRINGE | INTRAVENOUS | Status: AC
  Filled 2020-07-11: qty 5

## 2020-07-11 SURGICAL SUPPLY — 48 items
APL PRP STRL LF DISP 70% ISPRP (MISCELLANEOUS) ×1
BINDER BREAST LRG (GAUZE/BANDAGES/DRESSINGS) IMPLANT
BINDER BREAST MEDIUM (GAUZE/BANDAGES/DRESSINGS) IMPLANT
BINDER BREAST XLRG (GAUZE/BANDAGES/DRESSINGS) ×3 IMPLANT
BINDER BREAST XXLRG (GAUZE/BANDAGES/DRESSINGS) IMPLANT
BLADE SURG 15 STRL SS SAFETY (BLADE) ×6 IMPLANT
CANISTER SUCT 1200ML W/VALVE (MISCELLANEOUS) ×3 IMPLANT
CHLORAPREP W/TINT 26 (MISCELLANEOUS) ×3 IMPLANT
CLOSURE WOUND 1/2 X4 (GAUZE/BANDAGES/DRESSINGS) ×1
CNTNR SPEC 2.5X3XGRAD LEK (MISCELLANEOUS)
CONT SPEC 4OZ STER OR WHT (MISCELLANEOUS)
CONT SPEC 4OZ STRL OR WHT (MISCELLANEOUS)
CONTAINER SPEC 2.5X3XGRAD LEK (MISCELLANEOUS) IMPLANT
COVER LIGHT HANDLE STERIS (MISCELLANEOUS) ×3 IMPLANT
COVER PROBE FLX POLY STRL (MISCELLANEOUS) ×3 IMPLANT
COVER WAND RF STERILE (DRAPES) ×3 IMPLANT
DEVICE DUBIN SPECIMEN MAMMOGRA (MISCELLANEOUS) ×3 IMPLANT
DRAPE LAPAROTOMY 100X77 ABD (DRAPES) ×3 IMPLANT
DRSG GAUZE FLUFF 36X18 (GAUZE/BANDAGES/DRESSINGS) ×3 IMPLANT
DRSG TELFA 4X3 1S NADH ST (GAUZE/BANDAGES/DRESSINGS) ×3 IMPLANT
ELECT CAUTERY BLADE TIP 2.5 (TIP) ×3
ELECT REM PT RETURN 9FT ADLT (ELECTROSURGICAL) ×3
ELECTRODE CAUTERY BLDE TIP 2.5 (TIP) ×1 IMPLANT
ELECTRODE REM PT RTRN 9FT ADLT (ELECTROSURGICAL) ×1 IMPLANT
GLOVE BIO SURGEON STRL SZ7.5 (GLOVE) ×3 IMPLANT
GLOVE INDICATOR 8.0 STRL GRN (GLOVE) ×3 IMPLANT
GOWN STRL REUS W/ TWL LRG LVL3 (GOWN DISPOSABLE) ×2 IMPLANT
GOWN STRL REUS W/TWL LRG LVL3 (GOWN DISPOSABLE) ×6
KIT TURNOVER KIT A (KITS) ×3 IMPLANT
LABEL OR SOLS (LABEL) ×3 IMPLANT
MARGIN MAP 10MM (MISCELLANEOUS) ×3 IMPLANT
NEEDLE HYPO 22GX1.5 SAFETY (NEEDLE) ×3 IMPLANT
NEEDLE HYPO 25X1 1.5 SAFETY (NEEDLE) ×3 IMPLANT
PACK BASIN MINOR (MISCELLANEOUS) ×3 IMPLANT
RETRACTOR RING XSMALL (MISCELLANEOUS) ×1 IMPLANT
RTRCTR WOUND ALEXIS 13CM XS SH (MISCELLANEOUS) ×3
SHEARS HARMONIC 9CM CVD (BLADE) IMPLANT
SPONGE LAP 18X18 RF (DISPOSABLE) ×3 IMPLANT
STRIP CLOSURE SKIN 1/2X4 (GAUZE/BANDAGES/DRESSINGS) ×2 IMPLANT
SUT ETHILON 3-0 FS-10 30 BLK (SUTURE) ×3
SUT VIC AB 2-0 CT1 27 (SUTURE) ×6
SUT VIC AB 2-0 CT1 TAPERPNT 27 (SUTURE) ×2 IMPLANT
SUT VIC AB 4-0 FS2 27 (SUTURE) ×3 IMPLANT
SUTURE EHLN 3-0 FS-10 30 BLK (SUTURE) ×1 IMPLANT
SWABSTK COMLB BENZOIN TINCTURE (MISCELLANEOUS) ×3 IMPLANT
SYR 10ML LL (SYRINGE) ×3 IMPLANT
TAPE TRANSPORE STRL 2 31045 (GAUZE/BANDAGES/DRESSINGS) IMPLANT
WATER STERILE IRR 1000ML POUR (IV SOLUTION) ×3 IMPLANT

## 2020-07-11 NOTE — Anesthesia Preprocedure Evaluation (Signed)
Anesthesia Evaluation  Patient identified by MRN, date of birth, ID band Patient awake    Reviewed: Allergy & Precautions, H&P , NPO status , Patient's Chart, lab work & pertinent test results, reviewed documented beta blocker date and time   History of Anesthesia Complications (+) history of anesthetic complications  Airway Mallampati: II  TM Distance: >3 FB Neck ROM: full    Dental  (+) Teeth Intact   Pulmonary neg pulmonary ROS, shortness of breath and with exertion, former smoker,    Pulmonary exam normal        Cardiovascular Exercise Tolerance: Poor hypertension, On Medications + CAD  negative cardio ROS Normal cardiovascular exam Rate:Normal     Neuro/Psych PSYCHIATRIC DISORDERS Anxiety Depression  Neuromuscular disease negative neurological ROS  negative psych ROS   GI/Hepatic negative GI ROS, Neg liver ROS, GERD  Medicated,  Endo/Other  negative endocrine ROS  Renal/GU negative Renal ROS  negative genitourinary   Musculoskeletal   Abdominal   Peds  Hematology negative hematology ROS (+)   Anesthesia Other Findings   Reproductive/Obstetrics negative OB ROS                             Anesthesia Physical Anesthesia Plan  ASA: III  Anesthesia Plan: General LMA   Post-op Pain Management:    Induction:   PONV Risk Score and Plan: 4 or greater  Airway Management Planned:   Additional Equipment:   Intra-op Plan:   Post-operative Plan:   Informed Consent: I have reviewed the patients History and Physical, chart, labs and discussed the procedure including the risks, benefits and alternatives for the proposed anesthesia with the patient or authorized representative who has indicated his/her understanding and acceptance.       Plan Discussed with: CRNA  Anesthesia Plan Comments:         Anesthesia Quick Evaluation

## 2020-07-11 NOTE — Op Note (Signed)
Preoperative diagnosis: Ruptured breast implant, silicone filled.  Postoperative diagnosis: Same.  Operative procedure: Removal of ruptured right subpectoral silicone implant.  Operating surgeon: Hervey Ard, MD.  Anesthesia: General by LMA, Marcaine 0.5% with 1: 200,000 units of epinephrine, 30 cc.  Estimated blood loss: 5 cc.  Clinical note: This 69 year old woman had a silicon filled breast implant placed 9 years ago.  Recent mammography shows evidence of rupture.  She desired implant removal without replacement.  She had SCD stockings for DVT prevention.  She received Ancef 2 g intravenously on induction of anesthesia.  Operative note: A small roll was placed behind the right shoulder and the right arm prevented from being hyperextended.  The breast chest and neck was then cleansed with ChloraPrep and draped.  A 6 cm lateral incision was made along the anterior axillary line along her old mastectomy incision.  This was done after infiltration of local anesthesia for postoperative comfort.  The skin was incised sharply and the remaining dissection completed with electrocautery.  The thinned pectoralis fibers were split and the capsule of the prosthesis was entered.  Using blunt dissection as well as sharp dissection the implant was freed from the capsule.  There was free silicon present.  After the implant was removed the cavity was carefully debrided of all residual silicon with both lap pads and towels.  After this had been completed it was irrigated with saline.  The redundant capsule material was excised with cautery and discarded.  The implant was discarded.  There was no evidence of capsular thickening or pathologic process noted except for the released silicon.  A 15 [place] Blake drain was brought out through the lower medial aspect of the flap and anchored in place with a 3-0 nylon suture.  The pectoralis muscle was reapproximated with a running 2-0 Vicryl suture.  The adipose layer  was approximated with a similar suture.  The skin was closed with a running 4-0 Vicryl subcuticular suture.  Benzoin and Steri-Strips followed by Telfa pad were applied.  Fluff gauze and a compressive wrap were placed.  The patient tolerated the procedure well and was taken to recovery in stable condition.

## 2020-07-11 NOTE — Anesthesia Postprocedure Evaluation (Signed)
Anesthesia Post Note  Patient: Nancy Day  Procedure(s) Performed: REMOVAL BREAST IMPLANTS (Right )  Patient location during evaluation: PACU Anesthesia Type: General Level of consciousness: awake and alert Pain management: pain level controlled Vital Signs Assessment: post-procedure vital signs reviewed and stable Respiratory status: spontaneous breathing, nonlabored ventilation, respiratory function stable and patient connected to nasal cannula oxygen Cardiovascular status: blood pressure returned to baseline and stable Postop Assessment: no apparent nausea or vomiting Anesthetic complications: no   No complications documented.   Last Vitals:  Vitals:   07/11/20 1611 07/11/20 1640  BP: (!) 151/72 126/63  Pulse: 80 78  Resp: 18 16  Temp: 36.5 C   SpO2: 97% 96%    Last Pain:  Vitals:   07/11/20 1640  TempSrc:   PainSc: 0-No pain                 Arita Miss

## 2020-07-11 NOTE — H&P (Signed)
Nancy Day [id number] 04-May-1951     HPI:   No change in clinical history or exam. Leaking right silicon implant from post mastectomy reconstruction done in 2012.  For removal.   Medications Prior to Admission  Medication Sig Dispense Refill Last Dose  . BIOTIN PO Take 2 tablets by mouth at bedtime.    07/09/2020  . cyclobenzaprine (FLEXERIL) 10 MG tablet Take 10 mg by mouth daily as needed for muscle spasms.    07/10/2020 at Unknown time  . diltiazem (CARDIZEM CD) 180 MG 24 hr capsule Take 180 mg by mouth daily.    07/11/2020 at 0800  . DULoxetine (CYMBALTA) 30 MG capsule Take 60 mg by mouth daily.    07/11/2020 at 0800  . Menthol, Topical Analgesic, (BIOFREEZE EX) Apply 1 patch topically as needed (pain on Back).     Marland Kitchen OVER THE COUNTER MEDICATION Take 10 mLs by mouth daily. Sea moss Gel     . potassium chloride (K-DUR) 10 MEQ tablet Take 10 mEq by mouth 2 (two) times daily.   07/10/2020 at Unknown time  . QUEtiapine (SEROQUEL) 50 MG tablet Take 25 mg by mouth daily.   07/10/2020 at Unknown time  . rosuvastatin (CRESTOR) 20 MG tablet Take 20 mg by mouth daily.   07/10/2020 at Unknown time  . telmisartan-hydrochlorothiazide (MICARDIS HCT) 80-25 MG tablet Take 1 tablet by mouth daily.   07/10/2020 at Unknown time  . Turmeric POWD Take 15 mLs by mouth daily as needed. Smoothie   05/10/2020   Allergies  Allergen Reactions  . Ace Inhibitors Nausea And Vomiting  . Hydralazine Nausea And Vomiting       . Hydrocodone Nausea And Vomiting    If smell  . Metoprolol Other (See Comments)    DIZZY AND FAINT   . Percocet [Oxycodone-Acetaminophen] Nausea And Vomiting    Dizzy   Past Medical History:  Diagnosis Date  . Allergy   . Anxiety   . Breast cancer ([place])   . Breast cancer, right ([place]) 1998   mastectomy and reconstruction in 2012  . Complication of anesthesia    low oxygen  . Coronary artery disease   . DDD (degenerative disc disease), lumbar   . Depression   . Dyspnea   . Fibromyalgia    . GERD (gastroesophageal reflux disease)   . Hypertension   . Spinal stenosis   . Vitamin D deficiency    Past Surgical History:  Procedure Laterality Date  . ABDOMINAL HYSTERECTOMY    . AUGMENTATION MAMMAPLASTY Right 2012   right breast with left breast reduction  . BREAST BIOPSY Right 1998   stereotactic biopsy - positive  . COLONOSCOPY WITH PROPOFOL N/A 11/21/2016   Procedure: COLONOSCOPY WITH PROPOFOL;  Surgeon: Lollie Sails, MD;  Location: [facility];  Service: Endoscopy;  Laterality: N/A;  . MASTECTOMY Right 1998  . REDUCTION MAMMAPLASTY Bilateral 2012   left   . SHOULDER SURGERY Right    rotator cuff  . TOE SURGERY Right    Social History   Socioeconomic History  . Marital status: Widowed    Spouse name: Not on file  . Number of children: Not on file  . Years of education: Not on file  . Highest education level: Not on file  Occupational History  . Not on file  Tobacco Use  . Smoking status: Former Smoker    Quit date: 07/10/1985    Years since quitting: 35.0  . Smokeless tobacco: Never Used  Vaping Use  .  Vaping Use: Never used  Substance and Sexual Activity  . Alcohol use: Yes    Comment: minimal  . Drug use: No  . Sexual activity: Not on file  Other Topics Concern  . Not on file  Social History Narrative  . Not on file   Social Determinants of Health   Financial Resource Strain:   . Difficulty of Paying Living Expenses: Not on file  Food Insecurity:   . Worried About Charity fundraiser in the Last Year: Not on file  . Ran Out of Food in the Last Year: Not on file  Transportation Needs:   . Lack of Transportation (Medical): Not on file  . Lack of Transportation (Non-Medical): Not on file  Physical Activity:   . Days of Exercise per Week: Not on file  . Minutes of Exercise per Session: Not on file  Stress:   . Feeling of Stress : Not on file  Social Connections:   . Frequency of Communication with Friends and Family: Not on file  .  Frequency of Social Gatherings with Friends and Family: Not on file  . Attends Religious Services: Not on file  . Active Member of Clubs or Organizations: Not on file  . Attends Archivist Meetings: Not on file  . Marital Status: Not on file  Intimate Partner Violence:   . Fear of Current or Ex-Partner: Not on file  . Emotionally Abused: Not on file  . Physically Abused: Not on file  . Sexually Abused: Not on file   Social History   Social History Narrative  . Not on file     ROS: Negative.     PE: HEENT: Negative. Lungs: Clear. Cardio: RR.    Assessment/Plan:  Proceed with planned right breast implant removal.    Forest Gleason [facility] 07/11/2020

## 2020-07-11 NOTE — Progress Notes (Signed)
1623:  Dr. Bary Castilla at bedside to speak with patient in Phase II.  Pt may be discharged home.

## 2020-07-11 NOTE — Discharge Instructions (Signed)

## 2020-07-11 NOTE — Transfer of Care (Signed)
Immediate Anesthesia Transfer of Care Note  Patient: Nancy Day  Procedure(s) Performed: REMOVAL BREAST IMPLANTS (Right )  Patient Location: PACU  Anesthesia Type:General  Level of Consciousness: drowsy and patient cooperative  Airway & Oxygen Therapy: Patient Spontanous Breathing and Patient connected to face mask oxygen  Post-op Assessment: Report given to RN and Post -op Vital signs reviewed and stable  Post vital signs: Reviewed and stable  Last Vitals:  Vitals Value Taken Time  BP 145/67 07/11/20 1509  Temp 36.7 C 07/11/20 1509  Pulse 88 07/11/20 1509  Resp 20 07/11/20 1509  SpO2 100 % 07/11/20 1509    Last Pain:  Vitals:   07/11/20 1305  TempSrc: Temporal  PainSc: 0-No pain         Complications: No complications documented.

## 2020-07-11 NOTE — Anesthesia Procedure Notes (Signed)
Procedure Name: LMA Insertion Date/Time: 07/11/2020 1:49 PM Performed by: Jonna Clark, CRNA Pre-anesthesia Checklist: Patient identified, Patient being monitored, Timeout performed, Emergency Drugs available and Suction available Patient Re-evaluated:Patient Re-evaluated prior to induction Oxygen Delivery Method: Circle system utilized Preoxygenation: Pre-oxygenation with 100% oxygen Induction Type: IV induction Ventilation: Mask ventilation without difficulty LMA: LMA inserted LMA Size: 4.0 Tube type: Oral Number of attempts: 1 Placement Confirmation: positive ETCO2 and breath sounds checked- equal and bilateral Tube secured with: Tape Dental Injury: Teeth and Oropharynx as per pre-operative assessment

## 2020-07-12 ENCOUNTER — Encounter: Payer: Self-pay | Admitting: General Surgery
